# Patient Record
Sex: Female | Born: 1940 | Race: White
Health system: Western US, Academic
[De-identification: ages and names within clinical notes are randomized; demographics above are authoritative.]

## PROBLEM LIST (undated history)

## (undated) DIAGNOSIS — Z79899 Other long term (current) drug therapy: Secondary | ICD-10-CM

## (undated) DIAGNOSIS — D84821 Immunodeficiency due to drugs: Secondary | ICD-10-CM

## (undated) DIAGNOSIS — I10 Essential (primary) hypertension: Secondary | ICD-10-CM

## (undated) DIAGNOSIS — K743 Primary biliary cirrhosis: Secondary | ICD-10-CM

## (undated) DIAGNOSIS — C8310 Mantle cell lymphoma, unspecified site: Secondary | ICD-10-CM

## (undated) HISTORY — PX: TOTAL HIP ARTHROPLASTY: SHX124

## (undated) HISTORY — PX: LIVER TRANSPLANT: SHX410

---

## 2000-11-28 ENCOUNTER — Inpatient Hospital Stay: Payer: Self-pay

## 2000-12-01 ENCOUNTER — Encounter (HOSPITAL_COMMUNITY): Payer: Self-pay

## 2000-12-12 ENCOUNTER — Encounter (HOSPITAL_COMMUNITY): Payer: Self-pay

## 2000-12-13 ENCOUNTER — Other Ambulatory Visit (HOSPITAL_BASED_OUTPATIENT_CLINIC_OR_DEPARTMENT_OTHER): Payer: Self-pay | Admitting: Transplant Surgery

## 2000-12-15 LAB — PATHOLOGY, SURGICAL

## 2001-03-14 ENCOUNTER — Other Ambulatory Visit (HOSPITAL_BASED_OUTPATIENT_CLINIC_OR_DEPARTMENT_OTHER): Payer: Self-pay | Admitting: Transplant Surgery Medicine

## 2001-03-16 LAB — PATHOLOGY, SURGICAL

## 2001-06-21 ENCOUNTER — Encounter (HOSPITAL_COMMUNITY): Payer: Self-pay

## 2001-07-14 ENCOUNTER — Other Ambulatory Visit (HOSPITAL_BASED_OUTPATIENT_CLINIC_OR_DEPARTMENT_OTHER): Payer: Self-pay | Admitting: Gastroenterology

## 2001-07-14 ENCOUNTER — Ambulatory Visit: Payer: Enrolled Prime—HMO

## 2001-07-17 LAB — PATHOLOGY, SURGICAL

## 2002-01-03 ENCOUNTER — Ambulatory Visit: Payer: Enrolled Prime—HMO

## 2002-01-03 ENCOUNTER — Other Ambulatory Visit (HOSPITAL_BASED_OUTPATIENT_CLINIC_OR_DEPARTMENT_OTHER): Payer: Self-pay | Admitting: Gastroenterology

## 2002-01-03 ENCOUNTER — Other Ambulatory Visit: Payer: Enrolled Prime—HMO

## 2002-01-03 LAB — PROTHROMBIN AND PTT, EXTERNAL
Prothrombin INR, External: 0.9
Prothrombin Time Patient, External: 12.6
Prothrombin Time Patient, External: 24

## 2002-01-03 LAB — HEPATIC FUNCTION PANEL, EXTERNAL
ALT (GPT), External: 16
AST (GOT), External: 20
Albumin, External: 3.4
Alkaline Phosphatase (Total), External: 38
Bilirubin (Total), External: 0.7
Protein (Total), External: 5.9

## 2002-01-03 LAB — CBC (HEMOGRAM), EXTERNAL
Hematocrit, External: 31
MCV, External: 92
Platelet Count, External: 197
WBC, External: 4.45

## 2002-01-03 LAB — LIPID PANEL, EXTERNAL
Cholesterol (Total), External: 193
Triglyceride, External: 95

## 2002-01-03 LAB — PHOSPHATE, EXTERNAL: Phosphate, External: 4.3

## 2002-01-03 LAB — BASIC METABOLIC PANEL, EXTERNAL
Calcium, External: 9.3
Carbon Dioxide (Total), External: 25
Chloride, External: 108
Creatinine, External: 1.3
Glucose, External: 82
Potassium, External: 4.1
Sodium, External: 144
Urea Nitrogen, External: 37

## 2002-01-03 LAB — GAMMA GLUTAMYL TRANSFERASE, EXTERNAL: Gamma Glutamyl Transferase, External: 5

## 2002-01-03 LAB — MAGNESIUM, EXTERNAL: Magnesium, External: 2.1

## 2002-01-03 LAB — URIC ACID, EXTERNAL: Uric Acid, External: 4.9

## 2002-01-04 LAB — PATHOLOGY, SURGICAL

## 2002-01-09 ENCOUNTER — Encounter (HOSPITAL_COMMUNITY): Payer: Self-pay

## 2002-06-21 ENCOUNTER — Ambulatory Visit: Payer: Enrolled Prime—HMO

## 2002-06-21 ENCOUNTER — Other Ambulatory Visit (HOSPITAL_COMMUNITY): Payer: Self-pay

## 2002-06-21 ENCOUNTER — Other Ambulatory Visit: Payer: Enrolled Prime—HMO

## 2002-06-21 LAB — BASIC METABOLIC PANEL, EXTERNAL
Calcium, External: 9.6
Carbon Dioxide (Total), External: 24
Chloride, External: 107
Creatinine, External: 1.8
Glucose, External: 117
Potassium, External: 5
Sodium, External: 138
Urea Nitrogen, External: 46

## 2002-06-21 LAB — CBC (HEMOGRAM), EXTERNAL
Hematocrit, External: 29
MCV, External: 86
Platelet Count, External: 204
WBC, External: 3.29

## 2002-06-21 LAB — URIC ACID, EXTERNAL: Uric Acid, External: 5.8

## 2002-06-21 LAB — HEPATIC FUNCTION PANEL, EXTERNAL
ALT (GPT), External: 33
AST (GOT), External: 33
Albumin, External: 3.5
Alkaline Phosphatase (Total), External: 120
Bilirubin (Total), External: 0.5
Protein (Total), External: 6.5

## 2002-06-21 LAB — PROTHROMBIN AND PTT, EXTERNAL
Prothrombin INR, External: 1
Prothrombin Time Patient, External: 13.2
Prothrombin Time Patient, External: 27

## 2002-06-21 LAB — LIPID PANEL, EXTERNAL
Cholesterol (Total), External: 198
Triglyceride, External: 67

## 2002-06-21 LAB — MAGNESIUM, EXTERNAL: Magnesium, External: 1.9

## 2002-06-21 LAB — GAMMA GLUTAMYL TRANSFERASE, EXTERNAL: Gamma Glutamyl Transferase, External: 69

## 2002-06-21 LAB — PHOSPHATE, EXTERNAL: Phosphate, External: 4.9

## 2003-01-01 ENCOUNTER — Other Ambulatory Visit (HOSPITAL_COMMUNITY): Payer: Self-pay

## 2003-01-01 ENCOUNTER — Encounter (HOSPITAL_COMMUNITY): Payer: Self-pay

## 2003-01-01 ENCOUNTER — Ambulatory Visit: Payer: Enrolled Prime—HMO

## 2003-01-01 LAB — HEPATIC FUNCTION PANEL, EXTERNAL
ALT (GPT), External: 23
AST (GOT), External: 32
Albumin, External: 3.5
Alkaline Phosphatase (Total), External: 98
Bilirubin (Total), External: 0.6
Protein (Total), External: 6.6

## 2003-01-01 LAB — CBC (HEMOGRAM), EXTERNAL
Hematocrit, External: 27
MCV, External: 89
Platelet Count, External: 161
WBC, External: 2.82

## 2003-01-01 LAB — BASIC METABOLIC PANEL, EXTERNAL
Calcium, External: 9.3
Carbon Dioxide (Total), External: 24
Chloride, External: 109
Creatinine, External: 2
Glucose, External: 103
Potassium, External: 4.8
Sodium, External: 140
Urea Nitrogen, External: 46

## 2003-01-01 LAB — LIPID PANEL, EXTERNAL
Cholesterol (Total), External: 191
Triglyceride, External: 81

## 2003-01-01 LAB — URIC ACID, EXTERNAL: Uric Acid, External: 5.8

## 2003-01-01 LAB — PROTHROMBIN AND PTT, EXTERNAL
Prothrombin INR, External: 1
Prothrombin Time Patient, External: 13.8
Prothrombin Time Patient, External: 28

## 2003-01-01 LAB — MAGNESIUM, EXTERNAL: Magnesium, External: 2.3

## 2003-01-01 LAB — GAMMA GLUTAMYL TRANSFERASE, EXTERNAL: Gamma Glutamyl Transferase, External: 40

## 2003-01-01 LAB — PHOSPHATE, EXTERNAL: Phosphate, External: 4.2

## 2004-01-24 ENCOUNTER — Encounter (HOSPITAL_COMMUNITY): Payer: Self-pay

## 2004-07-08 ENCOUNTER — Other Ambulatory Visit (HOSPITAL_BASED_OUTPATIENT_CLINIC_OR_DEPARTMENT_OTHER): Payer: Self-pay | Admitting: Anatomic Pathology

## 2004-07-10 LAB — PATHOLOGY, SURGICAL

## 2004-11-17 ENCOUNTER — Encounter (HOSPITAL_COMMUNITY): Payer: Self-pay

## 2005-01-29 ENCOUNTER — Other Ambulatory Visit (HOSPITAL_COMMUNITY): Payer: Self-pay

## 2005-06-09 ENCOUNTER — Other Ambulatory Visit (HOSPITAL_COMMUNITY): Payer: Self-pay

## 2005-06-09 LAB — BASIC METABOLIC PANEL, EXTERNAL
Calcium, External: 9.4
Carbon Dioxide (Total), External: 25
Chloride, External: 103
Creatinine, External: 1.6
Glucose, External: 75
Potassium, External: 4.5
Sodium, External: 137
Urea Nitrogen, External: 40

## 2005-06-09 LAB — HEPATIC FUNCTION PANEL, EXTERNAL
ALT (GPT), External: 41
AST (GOT), External: 38
Bilirubin (Total), External: 0.3

## 2005-06-09 LAB — CBC (HEMOGRAM), EXTERNAL
Hematocrit, External: 30.9
MCV, External: 86.6
Platelet Count, External: 256
WBC, External: 3.2

## 2005-06-09 LAB — TACROLIMUS, EXTERNAL: Tacrolimus, External: 4

## 2005-06-09 LAB — LIPID PANEL, EXTERNAL
Cholesterol (Total), External: 185
Triglyceride, External: 74

## 2005-06-09 LAB — GAMMA GLUTAMYL TRANSFERASE, EXTERNAL: Gamma Glutamyl Transferase, External: 53

## 2005-06-09 LAB — PHOSPHATE, EXTERNAL: Phosphate, External: 3.5

## 2005-06-09 LAB — MAGNESIUM, EXTERNAL: Magnesium, External: 2.2

## 2005-07-20 ENCOUNTER — Other Ambulatory Visit (HOSPITAL_COMMUNITY): Payer: Self-pay

## 2005-07-20 LAB — BASIC METABOLIC PANEL, EXTERNAL
Calcium, External: 9.4
Carbon Dioxide (Total), External: 24
Chloride, External: 106
Creatinine, External: 1.9
Glucose, External: 96
Potassium, External: 4.9
Sodium, External: 138
Urea Nitrogen, External: 47

## 2005-07-20 LAB — HEPATIC FUNCTION PANEL, EXTERNAL
ALT (GPT), External: 42
AST (GOT), External: 35
Albumin, External: 4.2
Alkaline Phosphatase (Total), External: 101
Bilirubin (Total), External: 0.3
Protein (Total), External: 7.3

## 2005-07-20 LAB — TACROLIMUS, EXTERNAL: Tacrolimus, External: 3

## 2005-07-20 LAB — CBC (HEMOGRAM), EXTERNAL
Hematocrit, External: 34.5
MCV, External: 92
Platelet Count, External: 279
WBC, External: 3.54

## 2005-08-26 ENCOUNTER — Other Ambulatory Visit (HOSPITAL_COMMUNITY): Payer: Self-pay

## 2005-08-26 LAB — BASIC METABOLIC PANEL, EXTERNAL
Calcium, External: 9.3
Carbon Dioxide (Total), External: 26
Chloride, External: 104
Creatinine, External: 1.7
Glucose, External: 86
Potassium, External: 5.1
Sodium, External: 142
Urea Nitrogen, External: 34

## 2005-08-26 LAB — HEPATIC FUNCTION PANEL, EXTERNAL
ALT (GPT), External: 43
AST (GOT), External: 38
Albumin, External: 4.3
Alkaline Phosphatase (Total), External: 108
Bilirubin (Total), External: 0.4
Protein (Total), External: 7.4

## 2005-08-26 LAB — CBC (HEMOGRAM), EXTERNAL
Hematocrit, External: 33.7
MCV, External: 89.5
Platelet Count, External: 276
WBC, External: 3.78

## 2005-08-26 LAB — TACROLIMUS, EXTERNAL: Tacrolimus, External: 7.2

## 2005-11-19 ENCOUNTER — Encounter (HOSPITAL_COMMUNITY): Payer: Self-pay

## 2006-01-18 ENCOUNTER — Other Ambulatory Visit (HOSPITAL_COMMUNITY): Payer: Self-pay

## 2006-01-18 LAB — HEPATIC FUNCTION PANEL, EXTERNAL
ALT (GPT), External: 42
AST (GOT), External: 32
Albumin, External: 4.3
Alkaline Phosphatase (Total), External: 102
Bilirubin (Total), External: 0.3
Protein (Total), External: 7.1

## 2006-01-18 LAB — LIPID PANEL, EXTERNAL
Cholesterol (HDL), External: 52
Cholesterol (LDL), External: 108
Cholesterol (Total), External: 183
Triglyceride, External: 113

## 2006-01-18 LAB — BASIC METABOLIC PANEL, EXTERNAL
Calcium, External: 9.5
Carbon Dioxide (Total), External: 26
Chloride, External: 104
Creatinine, External: 1.6
Glucose, External: 92
Potassium, External: 4.6
Sodium, External: 140
Urea Nitrogen, External: 41

## 2006-01-18 LAB — CBC (HEMOGRAM), EXTERNAL
Hematocrit, External: 32.8
MCV, External: 91.9
Platelet Count, External: 283
WBC, External: 3.41

## 2006-01-18 LAB — MAGNESIUM, EXTERNAL: Magnesium, External: 2.1

## 2006-01-18 LAB — PROTHROMBIN AND PTT, EXTERNAL
Prothrombin INR, External: 1
Prothrombin Time Patient, External: 12.9

## 2006-01-18 LAB — GAMMA GLUTAMYL TRANSFERASE, EXTERNAL: Gamma Glutamyl Transferase, External: 68

## 2006-01-18 LAB — PROTEIN/CREATININE RATIO, URN, EXTERNAL: Protein (Total)/24 hr, URN, External: 121

## 2006-01-18 LAB — CREATININE CLEARANCE, URN, EXTERNAL
Creatinine Clearance, External: 32
Total Volume, External: 3025

## 2006-01-18 LAB — CYCLOSPORINE, EXTERNAL: Cyclosporine By LC_MS, External: <50

## 2006-02-21 ENCOUNTER — Other Ambulatory Visit (HOSPITAL_COMMUNITY): Payer: Self-pay

## 2006-02-21 LAB — HEPATIC FUNCTION PANEL, EXTERNAL
ALT (GPT), External: 36
AST (GOT), External: 28
Albumin, External: 4.5
Alkaline Phosphatase (Total), External: 108
Bilirubin (Total), External: 0.3
Protein (Total), External: 7.3

## 2006-02-21 LAB — CBC (HEMOGRAM), EXTERNAL
Hematocrit, External: 32
MCV, External: 89.2
Platelet Count, External: 295
WBC, External: 3.32

## 2006-02-21 LAB — BASIC METABOLIC PANEL, EXTERNAL
Calcium, External: 9.5
Carbon Dioxide (Total), External: 27
Chloride, External: 106
Creatinine, External: 1.5
Glucose, External: 101
Potassium, External: 4.9
Sodium, External: 141
Urea Nitrogen, External: 48

## 2006-02-21 LAB — TACROLIMUS, EXTERNAL: Tacrolimus, External: 6.3

## 2006-04-18 ENCOUNTER — Encounter: Payer: PRIVATE HEALTH INSURANCE | Admitting: Nephrology

## 2006-06-08 ENCOUNTER — Encounter (HOSPITAL_BASED_OUTPATIENT_CLINIC_OR_DEPARTMENT_OTHER): Payer: TRICARE Standard - Fee For Service | Admitting: Nephrology

## 2006-07-18 ENCOUNTER — Other Ambulatory Visit (HOSPITAL_COMMUNITY): Payer: Self-pay

## 2006-08-01 ENCOUNTER — Other Ambulatory Visit (HOSPITAL_COMMUNITY): Payer: Self-pay

## 2006-08-01 LAB — CBC (HEMOGRAM), EXTERNAL
Hematocrit, External: 32
Hemoglobin, External: 10.9
MCV, External: 88.1
WBC, External: 4.07

## 2006-08-01 LAB — HEPATIC FUNCTION PANEL, EXTERNAL
ALT (GPT), External: 57
AST (GOT), External: 48
Albumin, External: 4.3
Alkaline Phosphatase (Total), External: 168
Bilirubin (Total), External: 0.3
Protein (Total), External: 7.4

## 2006-08-01 LAB — BASIC METABOLIC PANEL, EXTERNAL
Calcium, External: 9.4
Carbon Dioxide (Total), External: 29
Chloride, External: 104
Creatinine, External: 1.6
Glucose, External: 92
Potassium, External: 4.7
Sodium, External: 138
Urea Nitrogen, External: 37

## 2006-08-01 LAB — TACROLIMUS, EXTERNAL: Tacrolimus, External: 5

## 2006-09-07 ENCOUNTER — Encounter (HOSPITAL_BASED_OUTPATIENT_CLINIC_OR_DEPARTMENT_OTHER): Payer: TRICARE Standard - Fee For Service | Admitting: Nephrology

## 2006-09-14 ENCOUNTER — Encounter (HOSPITAL_BASED_OUTPATIENT_CLINIC_OR_DEPARTMENT_OTHER): Payer: TRICARE Standard - Fee For Service | Admitting: Nephrology

## 2006-09-16 ENCOUNTER — Other Ambulatory Visit (HOSPITAL_COMMUNITY): Payer: Self-pay

## 2006-09-16 LAB — CBC (HEMOGRAM), EXTERNAL
Hematocrit, External: 30.2
Hemoglobin, External: 10.6
MCV, External: 85.7
Platelet Count, External: 283
WBC, External: 2.89

## 2006-09-16 LAB — BASIC METABOLIC PANEL, EXTERNAL
Calcium, External: 9.5
Carbon Dioxide (Total), External: 28
Chloride, External: 106
Creatinine, External: 2.1
Glucose, External: 97
Potassium, External: 4.5
Sodium, External: 141
Urea Nitrogen, External: 42

## 2006-09-16 LAB — HEPATIC FUNCTION PANEL, EXTERNAL
ALT (GPT), External: 49
AST (GOT), External: 38
Albumin, External: 4.2
Alkaline Phosphatase (Total), External: 159
Bilirubin (Total), External: 0.3
Protein (Total), External: 7.3

## 2006-09-16 LAB — GAMMA GLUTAMYL TRANSFERASE, EXTERNAL: Gamma Glutamyl Transferase, External: 111

## 2006-09-16 LAB — TACROLIMUS, EXTERNAL: Tacrolimus, External: 4

## 2006-10-28 ENCOUNTER — Other Ambulatory Visit (HOSPITAL_COMMUNITY): Payer: Self-pay

## 2006-10-28 LAB — BASIC METABOLIC PANEL, EXTERNAL
Calcium, External: 9.6
Carbon Dioxide (Total), External: 26
Chloride, External: 106
Creatinine, External: 1.7
Glucose, External: 91
Potassium, External: 4.7
Sodium, External: 143
Urea Nitrogen, External: 48

## 2006-10-28 LAB — HEPATIC FUNCTION PANEL, EXTERNAL
ALT (GPT), External: 53
AST (GOT), External: 40
Albumin, External: 4.2
Alkaline Phosphatase (Total), External: 156
Bilirubin (Total), External: 0.3
Protein (Total), External: 7.2

## 2006-10-28 LAB — TACROLIMUS, EXTERNAL: Tacrolimus, External: 5

## 2006-10-28 LAB — CBC (HEMOGRAM), EXTERNAL
Hematocrit, External: 32
Hemoglobin, External: 10.8
MCV, External: 87.4
Platelet Count, External: 305
WBC, External: 4.03

## 2006-10-28 LAB — GAMMA GLUTAMYL TRANSFERASE, EXTERNAL: Gamma Glutamyl Transferase, External: 111

## 2006-12-19 ENCOUNTER — Other Ambulatory Visit (HOSPITAL_COMMUNITY): Payer: Self-pay

## 2006-12-19 LAB — HEPATIC FUNCTION PANEL, EXTERNAL
ALT (GPT), External: 46
AST (GOT), External: 41
Albumin, External: 4.3
Alkaline Phosphatase (Total), External: 171
Bilirubin (Total), External: 0.4
Protein (Total), External: 7.6

## 2006-12-19 LAB — GAMMA GLUTAMYL TRANSFERASE, EXTERNAL: Gamma Glutamyl Transferase, External: 105

## 2006-12-19 LAB — BASIC METABOLIC PANEL, EXTERNAL
Calcium, External: 9.7
Carbon Dioxide (Total), External: 24
Chloride, External: 106
Creatinine, External: 1.7
Glucose, External: 93
Potassium, External: 4.9
Sodium, External: 145
Urea Nitrogen, External: 37

## 2006-12-19 LAB — CBC (HEMOGRAM), EXTERNAL
Hematocrit, External: 32.8
Hemoglobin, External: 11.3
MCV, External: 84.2
Platelet Count, External: 348
WBC, External: 4.06

## 2006-12-19 LAB — LIPID PANEL, EXTERNAL
Cholesterol (HDL), External: 59
Cholesterol (LDL), External: 141
Cholesterol (Total), External: 225
Triglyceride, External: 124

## 2006-12-19 LAB — PROTHROMBIN AND PTT, EXTERNAL
Prothrombin INR, External: 1
Prothrombin Time Patient, External: 12.9

## 2006-12-19 LAB — MAGNESIUM, EXTERNAL: Magnesium, External: 2.3

## 2006-12-19 LAB — CYCLOSPORINE, EXTERNAL: Cyclosporine By LC_MS, External: <50

## 2006-12-19 LAB — URIC ACID, EXTERNAL: Uric Acid, External: 7.1

## 2006-12-19 LAB — TACROLIMUS, EXTERNAL: Tacrolimus, External: 4.2

## 2007-01-03 ENCOUNTER — Other Ambulatory Visit (HOSPITAL_COMMUNITY): Payer: Self-pay

## 2007-01-03 LAB — PROTEIN/CREATININE RATIO, URN, EXTERNAL
Creatinine/Unit, URN, External: 30
Protein (Total)/24 hr, URN, External: 70

## 2007-01-03 LAB — CREATININE CLEARANCE, URN, EXTERNAL
Creatinine Clearance, External: 35
Total Volume, External: 3500

## 2007-01-03 LAB — BASIC METABOLIC PANEL, EXTERNAL: Creatinine, External: 1.7

## 2007-02-24 ENCOUNTER — Encounter (HOSPITAL_BASED_OUTPATIENT_CLINIC_OR_DEPARTMENT_OTHER): Payer: TRICARE Standard - Fee For Service

## 2007-03-03 ENCOUNTER — Encounter (HOSPITAL_BASED_OUTPATIENT_CLINIC_OR_DEPARTMENT_OTHER): Payer: TRICARE Standard - Fee For Service

## 2007-03-15 ENCOUNTER — Encounter (HOSPITAL_BASED_OUTPATIENT_CLINIC_OR_DEPARTMENT_OTHER): Payer: TRICARE Standard - Fee For Service | Admitting: Nephrology

## 2007-03-22 ENCOUNTER — Other Ambulatory Visit (HOSPITAL_COMMUNITY): Payer: Self-pay

## 2007-03-22 LAB — BASIC METABOLIC PANEL, EXTERNAL
Calcium, External: 10.1
Carbon Dioxide (Total), External: 30
Chloride, External: 105
Creatinine, External: 1.8
Glucose, External: 91
Potassium, External: 4.4
Sodium, External: 142
Urea Nitrogen, External: 36

## 2007-03-22 LAB — CBC (HEMOGRAM), EXTERNAL
Hematocrit, External: 31.5
Hemoglobin, External: 10.8
MCV, External: 87.4
Platelet Count, External: 327
WBC, External: 3.45

## 2007-03-22 LAB — HEPATIC FUNCTION PANEL, EXTERNAL
ALT (GPT), External: 50
AST (GOT), External: 41
Albumin, External: 4.3
Alkaline Phosphatase (Total), External: 171
Bilirubin (Total), External: 0.4
Protein (Total), External: 7.4

## 2007-03-22 LAB — GAMMA GLUTAMYL TRANSFERASE, EXTERNAL: Gamma Glutamyl Transferase, External: 116

## 2007-03-22 LAB — TACROLIMUS, EXTERNAL: Tacrolimus, External: 5

## 2007-05-31 ENCOUNTER — Other Ambulatory Visit (HOSPITAL_COMMUNITY): Payer: Self-pay

## 2007-05-31 LAB — CBC (HEMOGRAM), EXTERNAL
Hematocrit, External: 33.3
Hemoglobin, External: 10.7
MCV, External: 88.5
Platelet Count, External: 335
WBC, External: 4.22

## 2007-05-31 LAB — HEPATIC FUNCTION PANEL, EXTERNAL
ALT (GPT), External: 58
AST (GOT), External: 42
Albumin, External: 4.3
Alkaline Phosphatase (Total), External: 182
Bilirubin (Total), External: 0.4
Protein (Total), External: 7

## 2007-05-31 LAB — TACROLIMUS, EXTERNAL: Tacrolimus, External: 4

## 2007-05-31 LAB — BASIC METABOLIC PANEL, EXTERNAL
Calcium, External: 9.7
Carbon Dioxide (Total), External: 28
Chloride, External: 106
Creatinine, External: 1.7
Glucose, External: 93
Potassium, External: 4.9
Sodium, External: 140
Urea Nitrogen, External: 44

## 2007-05-31 LAB — GAMMA GLUTAMYL TRANSFERASE, EXTERNAL: Gamma Glutamyl Transferase, External: 120

## 2007-05-31 LAB — CBC, DIFF, EXTERNAL: Neutrophils, External: 2.82

## 2007-07-04 ENCOUNTER — Other Ambulatory Visit (HOSPITAL_COMMUNITY): Payer: Self-pay

## 2007-07-18 ENCOUNTER — Other Ambulatory Visit (HOSPITAL_COMMUNITY): Payer: Self-pay

## 2007-07-18 LAB — GAMMA GLUTAMYL TRANSFERASE, EXTERNAL: Gamma Glutamyl Transferase, External: 121

## 2007-07-18 LAB — BASIC METABOLIC PANEL, EXTERNAL
Calcium, External: 9.9
Carbon Dioxide (Total), External: 26
Chloride, External: 105
Creatinine, External: 1.8
Glucose, External: 96
Potassium, External: 4.7
Sodium, External: 141
Urea Nitrogen, External: 46

## 2007-07-18 LAB — HEPATIC FUNCTION PANEL, EXTERNAL
ALT (GPT), External: 58
AST (GOT), External: 44
Albumin, External: 4.4
Alkaline Phosphatase (Total), External: 190
Bilirubin (Total), External: 0.4
Protein (Total), External: 7.2

## 2007-07-18 LAB — CBC (HEMOGRAM), EXTERNAL
Hematocrit, External: 30.8
Hemoglobin, External: 10.6
MCV, External: 85.9
Platelet Count, External: 312
WBC, External: 4.49

## 2007-07-18 LAB — CBC, DIFF, EXTERNAL: Neutrophils, External: 3.05

## 2007-07-19 ENCOUNTER — Encounter (HOSPITAL_BASED_OUTPATIENT_CLINIC_OR_DEPARTMENT_OTHER): Payer: TRICARE Standard - Fee For Service | Admitting: Nephrology

## 2007-08-17 ENCOUNTER — Other Ambulatory Visit (HOSPITAL_COMMUNITY): Payer: Self-pay

## 2007-08-17 LAB — CBC (HEMOGRAM), EXTERNAL
Hematocrit, External: 31.8
Hemoglobin, External: 10.4
MCV, External: 88.7
Platelet Count, External: 325
WBC, External: 4.08

## 2007-08-17 LAB — HEPATIC FUNCTION PANEL, EXTERNAL
ALT (GPT), External: 64
AST (GOT), External: 51
Albumin, External: 4.3
Alkaline Phosphatase (Total), External: 211
Bilirubin (Total), External: 0.4
Protein (Total), External: 7.3

## 2007-08-17 LAB — BASIC METABOLIC PANEL, EXTERNAL
Calcium, External: 9.7
Carbon Dioxide (Total), External: 26
Chloride, External: 106
Creatinine, External: 1.8
Glucose, External: 97
Potassium, External: 5
Sodium, External: 141
Urea Nitrogen, External: 49

## 2007-08-17 LAB — 1,25 DIHYDROXY VITAMIN D, EXTERNAL: Vitamin D (1,25 Dihydroxy): 47

## 2007-08-17 LAB — GAMMA GLUTAMYL TRANSFERASE, EXTERNAL: Gamma Glutamyl Transferase, External: 136

## 2007-08-17 LAB — TACROLIMUS, EXTERNAL: Tacrolimus, External: 5

## 2007-08-17 LAB — CBC, DIFF, EXTERNAL: Neutrophils, External: 2.64

## 2007-09-20 ENCOUNTER — Other Ambulatory Visit (HOSPITAL_COMMUNITY): Payer: Self-pay

## 2007-09-20 LAB — CBC (HEMOGRAM), EXTERNAL
Hematocrit, External: 32.7
Hemoglobin, External: 10.7
MCV, External: 89.4
Platelet Count, External: 309
WBC, External: 3.61

## 2007-09-20 LAB — BASIC METABOLIC PANEL, EXTERNAL
Calcium, External: 9.5
Carbon Dioxide (Total), External: 27
Chloride, External: 106
Creatinine, External: 1.8
Glucose, External: 102
Potassium, External: 4.5
Sodium, External: 142
Urea Nitrogen, External: 41

## 2007-09-20 LAB — HEPATIC FUNCTION PANEL, EXTERNAL
ALT (GPT), External: 52
AST (GOT), External: 47
Albumin, External: 4.2
Alkaline Phosphatase (Total), External: 218
Bilirubin (Total), External: 0.4
Protein (Total), External: 7.3

## 2007-09-20 LAB — TACROLIMUS, EXTERNAL: Tacrolimus, External: 8.5

## 2007-09-20 LAB — GAMMA GLUTAMYL TRANSFERASE, EXTERNAL: Gamma Glutamyl Transferase, External: 137

## 2007-09-20 LAB — CBC, DIFF, EXTERNAL: Neutrophils, External: 2.33

## 2007-09-21 ENCOUNTER — Other Ambulatory Visit (HOSPITAL_COMMUNITY): Payer: Self-pay

## 2007-10-19 ENCOUNTER — Other Ambulatory Visit (HOSPITAL_COMMUNITY): Payer: Self-pay

## 2007-10-19 LAB — CBC (HEMOGRAM), EXTERNAL
Hematocrit, External: 32.2
Hemoglobin, External: 10.8
MCV, External: 89.3
Platelet Count, External: 303
WBC, External: 3.54

## 2007-10-19 LAB — HEPATIC FUNCTION PANEL, EXTERNAL
ALT (GPT), External: 65
AST (GOT), External: 47
Albumin, External: 4.3
Alkaline Phosphatase (Total), External: 232
Bilirubin (Total), External: 0.4
Protein (Total), External: 7.3

## 2007-10-19 LAB — BASIC METABOLIC PANEL, EXTERNAL
Calcium, External: 9.7
Carbon Dioxide (Total), External: 26
Chloride, External: 105
Creatinine, External: 1.7
Glucose, External: 92
Potassium, External: 4.3
Sodium, External: 141
Urea Nitrogen, External: 42

## 2007-10-19 LAB — TACROLIMUS, EXTERNAL: Tacrolimus, External: 4

## 2007-10-19 LAB — GAMMA GLUTAMYL TRANSFERASE, EXTERNAL: Gamma Glutamyl Transferase, External: 157

## 2007-10-19 LAB — CBC, DIFF, EXTERNAL: Neutrophils, External: 2.27

## 2007-12-13 ENCOUNTER — Other Ambulatory Visit (HOSPITAL_COMMUNITY): Payer: Self-pay

## 2007-12-13 LAB — CBC (HEMOGRAM), EXTERNAL
Hematocrit, External: 32
Hemoglobin, External: 10.9
MCV, External: 85.9
Platelet Count, External: 332
WBC, External: 3.57

## 2007-12-13 LAB — CBC, DIFF, EXTERNAL: Neutrophils, External: 2.19

## 2007-12-13 LAB — BASIC METABOLIC PANEL, EXTERNAL
Calcium, External: 10
Carbon Dioxide (Total), External: 28
Chloride, External: 106
Creatinine, External: 1.8
Glucose, External: 91
Potassium, External: 4.7
Sodium, External: 142
Urea Nitrogen, External: 44

## 2007-12-13 LAB — HEPATIC FUNCTION PANEL, EXTERNAL
ALT (GPT), External: 69
AST (GOT), External: 50
Albumin, External: 4.4
Alkaline Phosphatase (Total), External: 250
Bilirubin (Total), External: 0.4
Protein (Total), External: 7.4

## 2007-12-13 LAB — TACROLIMUS, EXTERNAL: Tacrolimus, External: 4

## 2007-12-13 LAB — GAMMA GLUTAMYL TRANSFERASE, EXTERNAL: Gamma Glutamyl Transferase, External: 175

## 2008-01-17 ENCOUNTER — Other Ambulatory Visit (HOSPITAL_COMMUNITY): Payer: Self-pay

## 2008-01-17 LAB — HEPATIC FUNCTION PANEL, EXTERNAL
ALT (GPT), External: 66
AST (GOT), External: 53
Albumin, External: 4.4
Alkaline Phosphatase (Total), External: 226
Bilirubin (Total), External: 0.4
Protein (Total), External: 7.4

## 2008-01-17 LAB — URIC ACID, EXTERNAL: Uric Acid, External: 6.8

## 2008-01-17 LAB — CBC, DIFF, EXTERNAL: Neutrophils, External: 2.96

## 2008-01-17 LAB — BASIC METABOLIC PANEL, EXTERNAL
Calcium, External: 9.7
Carbon Dioxide (Total), External: 26
Chloride, External: 101
Creatinine, External: 1.7
Glucose, External: 91
Potassium, External: 4.1
Sodium, External: 141
Urea Nitrogen, External: 39

## 2008-01-17 LAB — CBC (HEMOGRAM), EXTERNAL
Hematocrit, External: 31.9
Hemoglobin, External: 10.9
MCV, External: 85.8
Platelet Count, External: 348
WBC, External: 4.37

## 2008-01-17 LAB — PROTHROMBIN AND PTT, EXTERNAL
Prothrombin INR, External: 1
Prothrombin Time Patient, External: 12.8

## 2008-01-17 LAB — CREATININE CLEARANCE, URN, EXTERNAL
Creatinine Clearance, External: 21
Total Volume, External: 4350

## 2008-01-17 LAB — LIPID PANEL, EXTERNAL
Cholesterol (HDL), External: 61
Cholesterol (LDL), External: 126
Cholesterol (Total), External: 213
Triglyceride, External: 132

## 2008-01-17 LAB — PROTEIN/CREATININE RATIO, URN, EXTERNAL
Creatinine/24hr, URN, External: 609
Creatinine/Unit, URN, External: 14
Protein (Total)/24 hr, URN, External: 44

## 2008-01-17 LAB — MAGNESIUM, EXTERNAL: Magnesium, External: 2.1

## 2008-01-17 LAB — COMPREHENSIVE METABOLIC PANEL, EXTERNAL: GFR, Calc, European American, External: 32

## 2008-02-23 ENCOUNTER — Encounter (HOSPITAL_BASED_OUTPATIENT_CLINIC_OR_DEPARTMENT_OTHER): Payer: TRICARE Standard - Fee For Service

## 2008-04-22 ENCOUNTER — Other Ambulatory Visit (HOSPITAL_COMMUNITY): Payer: Self-pay

## 2008-04-22 LAB — BASIC METABOLIC PANEL, EXTERNAL
Calcium, External: 10
Carbon Dioxide (Total), External: 28
Chloride, External: 103
Creatinine, External: 1.7
Glucose, External: 97
Potassium, External: 4.4
Sodium, External: 142
Urea Nitrogen, External: 40

## 2008-04-22 LAB — CBC (HEMOGRAM), EXTERNAL
Hematocrit, External: 34.4
Hemoglobin, External: 11.2
MCV, External: 89.1
Platelet Count, External: 325
WBC, External: 4.88

## 2008-04-22 LAB — HEPATIC FUNCTION PANEL, EXTERNAL
ALT (GPT), External: 68
AST (GOT), External: 56
Albumin, External: 4.3
Alkaline Phosphatase (Total), External: 268
Bilirubin (Total), External: 0.3
Protein (Total), External: 7.4

## 2008-04-22 LAB — CBC, DIFF, EXTERNAL: Neutrophils, External: 3.1

## 2008-04-22 LAB — TACROLIMUS, EXTERNAL: Tacrolimus, External: 4.4

## 2008-04-22 LAB — GAMMA GLUTAMYL TRANSFERASE, EXTERNAL: Gamma Glutamyl Transferase, External: 214

## 2008-04-22 LAB — COMPREHENSIVE METABOLIC PANEL, EXTERNAL: GFR, Calc, European American, External: 32

## 2008-06-21 ENCOUNTER — Other Ambulatory Visit (HOSPITAL_COMMUNITY): Payer: Self-pay

## 2008-06-21 LAB — HEPATIC FUNCTION PANEL, EXTERNAL
ALT (GPT), External: 67
AST (GOT), External: 55
Albumin, External: 4
Alkaline Phosphatase (Total), External: 295
Bilirubin (Total), External: 0.4
Protein (Total), External: 7.6

## 2008-06-21 LAB — BASIC METABOLIC PANEL, EXTERNAL
Calcium, External: 9.9
Carbon Dioxide (Total), External: 26
Chloride, External: 102
Creatinine, External: 1.7
Glucose, External: 97
Potassium, External: 4.4
Sodium, External: 142
Urea Nitrogen, External: 40

## 2008-06-21 LAB — CBC (HEMOGRAM), EXTERNAL
Hematocrit, External: 34.8
Hemoglobin, External: 11.8
MCV, External: 85.7
Platelet Count, External: 335
WBC, External: 4.8

## 2008-06-21 LAB — TACROLIMUS, EXTERNAL: Tacrolimus, External: 4.7

## 2008-06-21 LAB — COMPREHENSIVE METABOLIC PANEL, EXTERNAL: GFR, Calc, European American, External: 32

## 2008-06-21 LAB — GAMMA GLUTAMYL TRANSFERASE, EXTERNAL: Gamma Glutamyl Transferase, External: 214

## 2008-06-21 LAB — CBC, DIFF, EXTERNAL: Neutrophils, External: 3.17

## 2008-08-20 ENCOUNTER — Other Ambulatory Visit (HOSPITAL_COMMUNITY): Payer: Self-pay

## 2008-08-20 LAB — BASIC METABOLIC PANEL, EXTERNAL
Calcium, External: 9.6
Carbon Dioxide (Total), External: 28
Chloride, External: 103
Creatinine, External: 1.8
Glucose, External: 96
Potassium, External: 4.2
Sodium, External: 140
Urea Nitrogen, External: 44

## 2008-08-20 LAB — COMPREHENSIVE METABOLIC PANEL, EXTERNAL: GFR, Calc, European American, External: 30

## 2008-08-20 LAB — CBC (HEMOGRAM), EXTERNAL
Hematocrit, External: 34.9
Hemoglobin, External: 11.3
MCV, External: 88.4
Platelet Count, External: 310
WBC, External: 3.92

## 2008-08-20 LAB — HEPATIC FUNCTION PANEL, EXTERNAL
ALT (GPT), External: 70
AST (GOT), External: 56
Albumin, External: 4.1
Alkaline Phosphatase (Total), External: 386
Bilirubin (Total), External: 0.4
Protein (Total), External: 7.2

## 2008-08-20 LAB — CBC, DIFF, EXTERNAL: Neutrophils, External: 2.5

## 2008-08-20 LAB — TACROLIMUS, EXTERNAL: Tacrolimus, External: 4.2

## 2008-08-20 LAB — GAMMA GLUTAMYL TRANSFERASE, EXTERNAL: Gamma Glutamyl Transferase, External: 208

## 2008-12-05 ENCOUNTER — Other Ambulatory Visit (HOSPITAL_COMMUNITY): Payer: Self-pay

## 2008-12-05 LAB — CBC (HEMOGRAM), EXTERNAL
Hematocrit, External: 35.1
Hemoglobin, External: 11.8
MCV, External: 88
Platelet Count, External: 271
WBC, External: 4.32

## 2008-12-05 LAB — HEPATIC FUNCTION PANEL, EXTERNAL
ALT (GPT), External: 42
AST (GOT), External: 34
Albumin, External: 4.5
Alkaline Phosphatase (Total), External: 191
Bilirubin (Total), External: 0.5
Protein (Total), External: 7.5

## 2008-12-05 LAB — TACROLIMUS, EXTERNAL: Tacrolimus, External: 4

## 2008-12-05 LAB — BASIC METABOLIC PANEL, EXTERNAL
Calcium, External: 9.7
Carbon Dioxide (Total), External: 28
Chloride, External: 103
Creatinine, External: 1.5
Glucose, External: 89
Potassium, External: 4.2
Sodium, External: 140
Urea Nitrogen, External: 38

## 2008-12-05 LAB — COMPREHENSIVE METABOLIC PANEL, EXTERNAL: GFR, Calc, European American, External: 37

## 2008-12-05 LAB — CBC, DIFF, EXTERNAL: Neutrophils, External: 2.82

## 2008-12-05 LAB — GAMMA GLUTAMYL TRANSFERASE, EXTERNAL: Gamma Glutamyl Transferase, External: 56

## 2009-01-07 ENCOUNTER — Other Ambulatory Visit (HOSPITAL_COMMUNITY): Payer: Self-pay

## 2009-01-07 LAB — PROTHROMBIN AND PTT, EXTERNAL
Prothrombin INR, External: 1
Prothrombin Time Patient, External: 12.9

## 2009-01-07 LAB — CREATININE CLEARANCE, URN, EXTERNAL
Creatinine Clearance, External: 34
Total Volume, External: 3200

## 2009-01-07 LAB — LIPID PANEL, EXTERNAL
Cholesterol (HDL), External: 69
Cholesterol (LDL), External: 142
Cholesterol (Total), External: 227
Triglyceride, External: 82

## 2009-01-07 LAB — HEPATIC FUNCTION PANEL, EXTERNAL
ALT (GPT), External: 49
AST (GOT), External: 34
Albumin, External: 4.5
Alkaline Phosphatase (Total), External: 227
Bilirubin (Total), External: 0.5
Protein (Total), External: 7.5

## 2009-01-07 LAB — BASIC METABOLIC PANEL, EXTERNAL
Calcium, External: 9.5
Carbon Dioxide (Total), External: 27
Chloride, External: 104
Creatinine, External: 1.6
Glucose, External: 95
Potassium, External: 3.9
Sodium, External: 140
Urea Nitrogen, External: 42

## 2009-01-07 LAB — 1,25 DIHYDROXY VITAMIN D, EXTERNAL: Vitamin D (1,25 Dihydroxy): 51

## 2009-01-07 LAB — PROTEIN/CREATININE RATIO, URN, EXTERNAL
Creatinine/Unit, URN, External: 30
Protein (Total)/24 hr, URN, External: 64

## 2009-01-07 LAB — GAMMA GLUTAMYL TRANSFERASE, EXTERNAL: Gamma Glutamyl Transferase, External: 62

## 2009-01-07 LAB — COMPREHENSIVE METABOLIC PANEL, EXTERNAL: GFR, Calc, European American, External: 34

## 2009-01-07 LAB — CBC (HEMOGRAM), EXTERNAL
Hematocrit, External: 35.8
Hemoglobin, External: 12
MCV, External: 88.7
Platelet Count, External: 267
WBC, External: 4.09

## 2009-01-07 LAB — PHOSPHATE, EXTERNAL: Phosphate, External: 3.8

## 2009-01-07 LAB — CBC, DIFF, EXTERNAL: Neutrophils, External: 2.66

## 2009-01-07 LAB — TACROLIMUS, EXTERNAL: Tacrolimus, External: 3.7

## 2009-01-07 LAB — MAGNESIUM, EXTERNAL: Magnesium, External: 2.3

## 2009-02-21 ENCOUNTER — Encounter (HOSPITAL_BASED_OUTPATIENT_CLINIC_OR_DEPARTMENT_OTHER): Payer: TRICARE Standard - Fee For Service

## 2009-02-28 ENCOUNTER — Encounter (HOSPITAL_BASED_OUTPATIENT_CLINIC_OR_DEPARTMENT_OTHER): Payer: TRICARE Standard - Fee For Service

## 2009-03-10 ENCOUNTER — Other Ambulatory Visit (HOSPITAL_COMMUNITY): Payer: Self-pay

## 2009-03-10 LAB — BASIC METABOLIC PANEL, EXTERNAL
Calcium, External: 9.5
Carbon Dioxide (Total), External: 30
Chloride, External: 105
Creatinine, External: 1.6
Glucose, External: 100
Potassium, External: 4.4
Sodium, External: 139
Urea Nitrogen, External: 39

## 2009-03-10 LAB — TACROLIMUS, EXTERNAL: Tacrolimus, External: 5

## 2009-03-10 LAB — CBC, DIFF, EXTERNAL: Neutrophils, External: 3.19

## 2009-03-10 LAB — CBC (HEMOGRAM), EXTERNAL
Hematocrit, External: 35.2
Hemoglobin, External: 11.6
MCV, External: 87.3
Platelet Count, External: 12.2
WBC, External: 4.8

## 2009-03-10 LAB — COMPREHENSIVE METABOLIC PANEL, EXTERNAL: GFR, Calc, European American, External: 34

## 2009-03-10 LAB — HEPATIC FUNCTION PANEL, EXTERNAL
ALT (GPT), External: 45
AST (GOT), External: 40
Albumin, External: 4.3
Alkaline Phosphatase (Total), External: 139
Bilirubin (Total), External: 0.4
Protein (Total), External: 7.1

## 2009-03-10 LAB — GAMMA GLUTAMYL TRANSFERASE, EXTERNAL: Gamma Glutamyl Transferase, External: 47

## 2009-05-09 ENCOUNTER — Ambulatory Visit: Payer: Medicare Other | Attending: Gastroenterology

## 2009-05-09 DIAGNOSIS — Z944 Liver transplant status: Secondary | ICD-10-CM | POA: Insufficient documentation

## 2009-05-09 DIAGNOSIS — K745 Biliary cirrhosis, unspecified: Secondary | ICD-10-CM | POA: Insufficient documentation

## 2009-05-23 ENCOUNTER — Other Ambulatory Visit (HOSPITAL_COMMUNITY): Payer: Self-pay

## 2009-05-28 ENCOUNTER — Other Ambulatory Visit (HOSPITAL_COMMUNITY): Payer: Self-pay

## 2009-06-25 ENCOUNTER — Other Ambulatory Visit (HOSPITAL_COMMUNITY): Payer: Self-pay

## 2009-06-25 LAB — CBC (HEMOGRAM), EXTERNAL
Hematocrit, External: 35.8
Hemoglobin, External: 11.7
MCV, External: 88.6
Platelet Count, External: 285
WBC, External: 4.73

## 2009-06-25 LAB — BASIC METABOLIC PANEL, EXTERNAL
Calcium, External: 9.7
Carbon Dioxide (Total), External: 28
Chloride, External: 105
Creatinine, External: 1.4
Glucose, External: 95
Potassium, External: 4.5
Sodium, External: 139
Urea Nitrogen, External: 34

## 2009-06-25 LAB — HEPATIC FUNCTION PANEL, EXTERNAL
ALT (GPT), External: 39
AST (GOT), External: 32
Albumin, External: 4.4
Alkaline Phosphatase (Total), External: 146
Bilirubin (Total), External: 0.4
Protein (Total), External: 7.1

## 2009-06-25 LAB — TACROLIMUS, EXTERNAL: Tacrolimus, External: 3

## 2009-06-25 LAB — MAGNESIUM, EXTERNAL: Magnesium, External: 2

## 2009-06-25 LAB — GAMMA GLUTAMYL TRANSFERASE, EXTERNAL: Gamma Glutamyl Transferase, External: 38

## 2009-06-25 LAB — URIC ACID, EXTERNAL: Uric Acid, External: 5.3

## 2009-06-25 LAB — COMPREHENSIVE METABOLIC PANEL, EXTERNAL: GFR, Calc, European American, External: 40

## 2009-06-25 LAB — CBC, DIFF, EXTERNAL: Neutrophils, External: 3.15

## 2009-10-30 ENCOUNTER — Other Ambulatory Visit (HOSPITAL_COMMUNITY): Payer: Self-pay

## 2009-10-30 LAB — BASIC METABOLIC PANEL, EXTERNAL
Calcium, External: 9.7
Carbon Dioxide (Total), External: 29
Chloride, External: 104
Creatinine, External: 1.5
Glucose, External: 95
Potassium, External: 4.4
Sodium, External: 141
Urea Nitrogen, External: 32

## 2009-10-30 LAB — CBC (HEMOGRAM), EXTERNAL
Hematocrit, External: 36.5
Hemoglobin, External: 12.2
MCV, External: 86.6
Platelet Count, External: 269
WBC, External: 4.93

## 2009-10-30 LAB — HEPATIC FUNCTION PANEL, EXTERNAL
ALT (GPT), External: 35
AST (GOT), External: 28
Albumin, External: 4.3
Alkaline Phosphatase (Total), External: 148
Bilirubin (Total), External: 0.5
Protein (Total), External: 7.3

## 2009-10-30 LAB — URIC ACID, EXTERNAL: Uric Acid, External: 6.2

## 2009-10-30 LAB — CBC, DIFF, EXTERNAL: Neutrophils, External: 3.33

## 2009-10-30 LAB — COMPREHENSIVE METABOLIC PANEL, EXTERNAL: GFR, Calc, European American, External: 37

## 2009-10-30 LAB — GAMMA GLUTAMYL TRANSFERASE, EXTERNAL: Gamma Glutamyl Transferase, External: 43

## 2009-10-30 LAB — MAGNESIUM, EXTERNAL: Magnesium, External: 2

## 2009-10-30 LAB — TACROLIMUS, EXTERNAL: Tacrolimus, External: 3

## 2009-11-30 ENCOUNTER — Other Ambulatory Visit: Payer: Self-pay

## 2009-12-07 ENCOUNTER — Other Ambulatory Visit: Payer: Self-pay

## 2010-01-13 ENCOUNTER — Other Ambulatory Visit (HOSPITAL_COMMUNITY): Payer: Self-pay

## 2010-01-13 LAB — BASIC METABOLIC PANEL, EXTERNAL
Calcium, External: 10.1
Carbon Dioxide (Total), External: 30
Chloride, External: 103
Creatinine, External: 1.7
Glucose, External: 94
Potassium, External: 4.3
Sodium, External: 140
Urea Nitrogen, External: 34

## 2010-01-13 LAB — HEPATIC FUNCTION PANEL, EXTERNAL
ALT (GPT), External: 37
AST (GOT), External: 27
Albumin, External: 4.5
Alkaline Phosphatase (Total), External: 116
Bilirubin (Total), External: 0.4
Protein (Total), External: 7.5

## 2010-01-13 LAB — CBC, DIFF, EXTERNAL: Neutrophils, External: 2.45

## 2010-01-13 LAB — CBC (HEMOGRAM), EXTERNAL
Hematocrit, External: 38.8
Hemoglobin, External: 12.6
MCV, External: 86.6
Platelet Count, External: 285
WBC, External: 3.89

## 2010-01-13 LAB — GAMMA GLUTAMYL TRANSFERASE, EXTERNAL: Gamma Glutamyl Transferase, External: 30

## 2010-01-13 LAB — COMPREHENSIVE METABOLIC PANEL, EXTERNAL: GFR, Calc, European American, External: 32

## 2010-01-13 LAB — MAGNESIUM, EXTERNAL: Magnesium, External: 2

## 2010-01-13 LAB — URIC ACID, EXTERNAL: Uric Acid, External: 6.2

## 2010-02-04 ENCOUNTER — Other Ambulatory Visit: Payer: Self-pay

## 2010-03-05 ENCOUNTER — Other Ambulatory Visit: Payer: Self-pay

## 2010-04-04 ENCOUNTER — Other Ambulatory Visit: Payer: Self-pay

## 2010-04-05 ENCOUNTER — Other Ambulatory Visit: Payer: Self-pay

## 2010-04-06 ENCOUNTER — Other Ambulatory Visit: Payer: Self-pay

## 2010-04-07 ENCOUNTER — Other Ambulatory Visit: Payer: Self-pay

## 2010-04-08 ENCOUNTER — Other Ambulatory Visit: Payer: Self-pay

## 2010-04-09 ENCOUNTER — Other Ambulatory Visit: Payer: Self-pay

## 2010-04-10 ENCOUNTER — Other Ambulatory Visit: Payer: Self-pay

## 2010-04-11 ENCOUNTER — Other Ambulatory Visit: Payer: Self-pay

## 2010-04-12 ENCOUNTER — Other Ambulatory Visit: Payer: Self-pay

## 2010-04-13 ENCOUNTER — Other Ambulatory Visit: Payer: Self-pay

## 2010-04-14 ENCOUNTER — Other Ambulatory Visit (HOSPITAL_COMMUNITY): Payer: Self-pay

## 2010-04-14 ENCOUNTER — Other Ambulatory Visit: Payer: Self-pay

## 2010-04-14 LAB — BASIC METABOLIC PANEL, EXTERNAL
Calcium, External: 9.8
Carbon Dioxide (Total), External: 31
Chloride, External: 104
Creatinine, External: 1.3
Glucose, External: 91
Potassium, External: 3.9
Sodium, External: 142
Urea Nitrogen, External: 29

## 2010-04-14 LAB — HEPATIC FUNCTION PANEL, EXTERNAL
ALT (GPT), External: 74
AST (GOT), External: 50
Albumin, External: 4.5
Alkaline Phosphatase (Total), External: 118
Bilirubin (Total), External: 0.4
Protein (Total), External: 7.5

## 2010-04-14 LAB — CBC (HEMOGRAM), EXTERNAL
Hematocrit, External: 39.9
Hemoglobin, External: 12.8
MCV, External: 89.7
Platelet Count, External: 278
WBC, External: 4.51

## 2010-04-14 LAB — MAGNESIUM, EXTERNAL: Magnesium, External: 1.9

## 2010-04-14 LAB — URIC ACID, EXTERNAL: Uric Acid, External: 5.5

## 2010-04-14 LAB — COMPREHENSIVE METABOLIC PANEL, EXTERNAL: GFR, Calc, European American, External: 43

## 2010-04-14 LAB — GAMMA GLUTAMYL TRANSFERASE, EXTERNAL: Gamma Glutamyl Transferase, External: 38

## 2010-04-14 LAB — TACROLIMUS, EXTERNAL: Tacrolimus, External: 3

## 2010-04-15 ENCOUNTER — Other Ambulatory Visit: Payer: Self-pay

## 2010-04-16 ENCOUNTER — Other Ambulatory Visit: Payer: Self-pay

## 2010-04-17 ENCOUNTER — Other Ambulatory Visit: Payer: Self-pay

## 2010-04-19 ENCOUNTER — Other Ambulatory Visit: Payer: Self-pay

## 2010-04-20 ENCOUNTER — Other Ambulatory Visit: Payer: Self-pay

## 2010-04-22 ENCOUNTER — Other Ambulatory Visit: Payer: Self-pay

## 2010-04-23 ENCOUNTER — Other Ambulatory Visit: Payer: Self-pay

## 2010-04-24 ENCOUNTER — Other Ambulatory Visit: Payer: Self-pay

## 2010-04-25 ENCOUNTER — Other Ambulatory Visit: Payer: Self-pay

## 2010-04-26 ENCOUNTER — Other Ambulatory Visit: Payer: Self-pay

## 2010-04-27 ENCOUNTER — Other Ambulatory Visit: Payer: Self-pay

## 2010-04-27 ENCOUNTER — Other Ambulatory Visit (HOSPITAL_COMMUNITY): Payer: Self-pay

## 2010-04-27 LAB — HEPATIC FUNCTION PANEL, EXTERNAL
ALT (GPT), External: 43
AST (GOT), External: 35
Albumin, External: 4.4
Alkaline Phosphatase (Total), External: 118
Bilirubin (Direct), External: 0.1
Bilirubin (Total), External: 0.5
Protein (Total), External: 7.4

## 2010-04-28 ENCOUNTER — Other Ambulatory Visit: Payer: Self-pay

## 2010-04-29 ENCOUNTER — Other Ambulatory Visit: Payer: Self-pay

## 2010-04-30 ENCOUNTER — Other Ambulatory Visit: Payer: Self-pay

## 2010-05-01 ENCOUNTER — Other Ambulatory Visit: Payer: Self-pay

## 2010-05-02 ENCOUNTER — Other Ambulatory Visit: Payer: Self-pay

## 2010-06-30 ENCOUNTER — Other Ambulatory Visit (HOSPITAL_COMMUNITY): Payer: Self-pay

## 2010-06-30 LAB — BASIC METABOLIC PANEL, EXTERNAL
Calcium, External: 9.5
Carbon Dioxide (Total), External: 32
Chloride, External: 107
Creatinine, External: 1.3
Glucose, External: 96
Potassium, External: 4.2
Sodium, External: 143
Urea Nitrogen, External: 31

## 2010-06-30 LAB — CBC (HEMOGRAM), EXTERNAL
Hematocrit, External: 37.9
Hemoglobin, External: 11.9
MCV, External: 90.1
Platelet Count, External: 235
WBC, External: 4.51

## 2010-06-30 LAB — CBC, DIFF, EXTERNAL: Neutrophils, External: 3.04

## 2010-06-30 LAB — URIC ACID, EXTERNAL: Uric Acid, External: 5.4

## 2010-06-30 LAB — HEPATIC FUNCTION PANEL, EXTERNAL
ALT (GPT), External: 45
AST (GOT), External: 39
Albumin, External: 4.3
Alkaline Phosphatase (Total), External: 127
Bilirubin (Total), External: 0.4
Protein (Total), External: 7.2

## 2010-06-30 LAB — COMPREHENSIVE METABOLIC PANEL, EXTERNAL: GFR, Calc, European American, External: 43

## 2010-06-30 LAB — MAGNESIUM, EXTERNAL: Magnesium, External: 2.1

## 2010-06-30 LAB — TACROLIMUS, EXTERNAL: Tacrolimus, External: 2

## 2010-06-30 LAB — GAMMA GLUTAMYL TRANSFERASE, EXTERNAL: Gamma Glutamyl Transferase, External: 37

## 2010-08-06 ENCOUNTER — Other Ambulatory Visit (HOSPITAL_COMMUNITY): Payer: Self-pay

## 2010-08-06 LAB — CBC (HEMOGRAM), EXTERNAL
Hematocrit, External: 37.9
Hemoglobin, External: 12.2
MCV, External: 88.3
Platelet Count, External: 252
WBC, External: 4.87

## 2010-08-06 LAB — BASIC METABOLIC PANEL, EXTERNAL
Calcium, External: 9.5
Carbon Dioxide (Total), External: 31
Chloride, External: 102
Creatinine, External: 1.4
Glucose, External: 87
Potassium, External: 3.9
Sodium, External: 140
Urea Nitrogen, External: 32

## 2010-08-06 LAB — HEPATIC FUNCTION PANEL, EXTERNAL
ALT (GPT), External: 31
AST (GOT), External: 28
Albumin, External: 4.3
Alkaline Phosphatase (Total), External: 116
Bilirubin (Total), External: 0.4
Protein (Total), External: 7.2

## 2010-08-06 LAB — GAMMA GLUTAMYL TRANSFERASE, EXTERNAL: Gamma Glutamyl Transferase, External: 33

## 2010-08-06 LAB — CBC, DIFF, EXTERNAL: Neutrophils, External: 3.5

## 2010-08-06 LAB — MAGNESIUM, EXTERNAL: Magnesium, External: 2

## 2010-08-06 LAB — URIC ACID, EXTERNAL: Uric Acid, External: 5.8

## 2010-08-06 LAB — TACROLIMUS, EXTERNAL: Tacrolimus, External: 2.9

## 2010-08-06 LAB — COMPREHENSIVE METABOLIC PANEL, EXTERNAL: GFR, Calc, European American, External: 40

## 2010-10-20 ENCOUNTER — Other Ambulatory Visit (HOSPITAL_COMMUNITY): Payer: Self-pay

## 2010-10-20 LAB — HEPATIC FUNCTION PANEL, EXTERNAL
ALT (GPT), External: 35
AST (GOT), External: 27
Albumin, External: 4.5
Alkaline Phosphatase (Total), External: 96
Bilirubin (Total), External: 0.5
Protein (Total), External: 7.3

## 2010-10-20 LAB — CBC (HEMOGRAM), EXTERNAL
Hematocrit, External: 39.9
Hemoglobin, External: 12.5
MCH, External: 27.9
MCV, External: 89.2
Platelet Count, External: 264
WBC, External: 3.79

## 2010-10-20 LAB — BASIC METABOLIC PANEL, EXTERNAL
Calcium, External: 9.7
Carbon Dioxide (Total), External: 30
Chloride, External: 105
Creatinine, External: 1.4
Glucose, External: 106
Potassium, External: 4.2
Sodium, External: 141
Urea Nitrogen, External: 35

## 2010-10-20 LAB — MAGNESIUM, EXTERNAL: Magnesium, External: 1.9

## 2010-10-20 LAB — URIC ACID, EXTERNAL: Uric Acid, External: 5.9

## 2010-10-20 LAB — TACROLIMUS, EXTERNAL: Tacrolimus, External: 3.1

## 2010-10-20 LAB — CBC, DIFF, EXTERNAL: Neutrophils, External: 2.66

## 2010-10-20 LAB — GAMMA GLUTAMYL TRANSFERASE, EXTERNAL: Gamma Glutamyl Transferase, External: 30

## 2010-10-20 LAB — COMPREHENSIVE METABOLIC PANEL, EXTERNAL: GFR, Calc, European American, External: 40

## 2011-02-22 ENCOUNTER — Other Ambulatory Visit (HOSPITAL_COMMUNITY): Payer: Self-pay

## 2011-02-22 LAB — PHOSPHATE, EXTERNAL: Phosphate, External: 3.6

## 2011-02-22 LAB — VITAMIN D (25 HYDROXY), EXTERNAL: Vit D (25_Hydroxy) Total, External: 31

## 2011-02-22 LAB — CBC, DIFF, EXTERNAL
% Basophils, External: 0.4
% Eosinophils, External: 6.8
% Lymphocytes, External: 17.9
% Monocytes, External: 5.1
% Neutrophils, External: 68.8
Basophils, External: 0.02
Eosinophil Count, External: 0.34
Lymphocyte Count, External: 0.89
Monocytes, External: 0.25
Neutrophils, External: 3.41

## 2011-02-22 LAB — CREATININE CLEARANCE, URN, EXTERNAL
Creatinine Clearance, External: 34
Total Volume, External: 3350

## 2011-02-22 LAB — HEPATIC FUNCTION PANEL, EXTERNAL
ALT (GPT), External: 45
AST (GOT), External: 38
Albumin, External: 4.5
Alkaline Phosphatase (Total), External: 125
Bilirubin (Direct), External: 0.2
Bilirubin (Total), External: 0.5
Protein (Total), External: 7.3

## 2011-02-22 LAB — BASIC METABOLIC PANEL, EXTERNAL
Calcium, External: 9.6
Carbon Dioxide (Total), External: 30
Chloride, External: 103
Creatinine, External: 1.3
Glucose, External: 91
Potassium, External: 3.8
Sodium, External: 141
Urea Nitrogen, External: 30

## 2011-02-22 LAB — PROTHROMBIN AND PTT, EXTERNAL
Prothrombin INR, External: 0.9
Prothrombin Time Patient, External: 11.9

## 2011-02-22 LAB — CBC (HEMOGRAM), EXTERNAL
Hematocrit, External: 39.6
Hemoglobin, External: 12.4
MCH, External: 28.3
MCV, External: 90.2
Platelet Count, External: 251
WBC, External: 4.95

## 2011-02-22 LAB — PROTEIN/CREATININE RATIO, URN, EXTERNAL
Creatinine/24hr, URN, External: 771
Creatinine/Unit, URN, External: 23
Protein (Total)/24 hr, URN, External: 101
Protein/Creatinine Ratio, External: 0.1

## 2011-02-22 LAB — MAGNESIUM, EXTERNAL: Magnesium, External: 2

## 2011-02-22 LAB — LIPID PANEL, EXTERNAL
Cholesterol (HDL), External: 63
Cholesterol (LDL), External: 127
Cholesterol (Total), External: 211
Triglyceride, External: 106

## 2011-02-22 LAB — GAMMA GLUTAMYL TRANSFERASE, EXTERNAL: Gamma Glutamyl Transferase, External: 211

## 2011-02-22 LAB — TACROLIMUS, EXTERNAL: Tacrolimus, External: 3.6

## 2011-02-22 LAB — COMPREHENSIVE METABOLIC PANEL, EXTERNAL: GFR, Calc, European American, External: 43

## 2011-02-22 LAB — SIROLIMUS, EXTERNAL: Sirolimus, External: <2

## 2011-02-22 LAB — CYCLOSPORINE, EXTERNAL: Cyclosporine By LC_MS, External: <15

## 2011-02-26 ENCOUNTER — Ambulatory Visit: Payer: Medicare Other | Attending: Gastroenterology

## 2011-02-26 DIAGNOSIS — N189 Chronic kidney disease, unspecified: Secondary | ICD-10-CM | POA: Insufficient documentation

## 2011-02-26 DIAGNOSIS — K745 Biliary cirrhosis, unspecified: Secondary | ICD-10-CM | POA: Insufficient documentation

## 2011-02-26 DIAGNOSIS — Z944 Liver transplant status: Secondary | ICD-10-CM | POA: Insufficient documentation

## 2011-05-19 ENCOUNTER — Other Ambulatory Visit (HOSPITAL_COMMUNITY): Payer: Self-pay

## 2011-05-19 LAB — CBC, DIFF, EXTERNAL
% Basophils, External: 0.3
% Eosinophils, External: 7.7
% Lymphocytes, External: 18.4
% Monocytes, External: 6.1
% Neutrophils, External: 65.9
Basophils, External: 0.02
Eosinophil Count, External: 0.36
Lymphocyte Count, External: 0.85
Monocytes, External: 0.28
Neutrophils, External: 3.04

## 2011-05-19 LAB — HEPATIC FUNCTION PANEL, EXTERNAL
ALT (GPT), External: 43
AST (GOT), External: 29
Albumin, External: 4.5
Alkaline Phosphatase (Total), External: 112
Bilirubin (Total), External: 0.5
Protein (Total), External: 7.1

## 2011-05-19 LAB — BASIC METABOLIC PANEL, EXTERNAL
Calcium, External: 9.6
Carbon Dioxide (Total), External: 31
Chloride, External: 103
Creatinine, External: 1.4
Glucose, External: 94
Potassium, External: 4.7
Sodium, External: 145
Urea Nitrogen, External: 33

## 2011-05-19 LAB — GAMMA GLUTAMYL TRANSFERASE, EXTERNAL: Gamma Glutamyl Transferase, External: 24

## 2011-05-19 LAB — COMPREHENSIVE METABOLIC PANEL, EXTERNAL: GFR, Calc, European American, External: 40

## 2011-05-19 LAB — TACROLIMUS, EXTERNAL: Tacrolimus, External: 2.8

## 2011-05-19 LAB — CBC (HEMOGRAM), EXTERNAL
Hematocrit, External: 37.7
Hemoglobin, External: 11.8
MCH, External: 27.9
MCV, External: 89.4
Platelet Count, External: 234
WBC, External: 4.62

## 2011-05-19 LAB — MAGNESIUM, EXTERNAL: Magnesium, External: 2

## 2011-05-19 LAB — URIC ACID, EXTERNAL: Uric Acid, External: 6.2

## 2011-06-30 ENCOUNTER — Other Ambulatory Visit (HOSPITAL_COMMUNITY): Payer: Self-pay

## 2011-06-30 LAB — BASIC METABOLIC PANEL, EXTERNAL
Calcium, External: 9.6
Carbon Dioxide (Total), External: 29
Chloride, External: 105
Creatinine, External: 1.4
Glucose, External: 89
Potassium, External: 4.1
Sodium, External: 142
Urea Nitrogen, External: 34

## 2011-06-30 LAB — MAGNESIUM, EXTERNAL: Magnesium, External: 2

## 2011-06-30 LAB — URIC ACID, EXTERNAL: Uric Acid, External: 5.8

## 2011-06-30 LAB — CBC (HEMOGRAM), EXTERNAL
Hematocrit, External: 39.3
Hemoglobin, External: 12.8
MCH, External: 28.5
MCV, External: 86.9
Platelet Count, External: 267
WBC, External: 4.85

## 2011-06-30 LAB — TACROLIMUS, EXTERNAL: Tacrolimus, External: 3.3

## 2011-06-30 LAB — CBC, DIFF, EXTERNAL
% Basophils, External: 0.8
% Eosinophils, External: 8.8
% Lymphocytes, External: 18
% Monocytes, External: 5.7
% Neutrophils, External: 65
Basophils, External: 0.04
Eosinophil Count, External: 0.42
Lymphocyte Count, External: 0.87
Monocytes, External: 0.28
Neutrophils, External: 3.15

## 2011-06-30 LAB — HEPATIC FUNCTION PANEL, EXTERNAL
ALT (GPT), External: 43
AST (GOT), External: 36
Albumin, External: 4.4
Alkaline Phosphatase (Total), External: 113
Bilirubin (Total), External: 0.5
Protein (Total), External: 7.2

## 2011-06-30 LAB — GAMMA GLUTAMYL TRANSFERASE, EXTERNAL: Gamma Glutamyl Transferase, External: 32

## 2011-06-30 LAB — COMPREHENSIVE METABOLIC PANEL, EXTERNAL: GFR, Calc, European American, External: 40

## 2011-09-08 ENCOUNTER — Other Ambulatory Visit (HOSPITAL_COMMUNITY): Payer: Self-pay

## 2011-09-08 LAB — HEPATIC FUNCTION PANEL, EXTERNAL
ALT (GPT), External: 46
AST (GOT), External: 34
Albumin, External: 4.3
Alkaline Phosphatase (Total), External: 119
Bilirubin (Total), External: 0.5
Protein (Total), External: 7.2

## 2011-09-08 LAB — CBC, DIFF, EXTERNAL
% Basophils, External: 0.4
% Eosinophils, External: 8.5
% Lymphocytes, External: 16.8
% Monocytes, External: 4.3
% Neutrophils, External: 69
Basophils, External: 0.02
Eosinophil Count, External: 0.5
Lymphocyte Count, External: 1
Monocytes, External: 0.26
Neutrophils, External: 4.11

## 2011-09-08 LAB — BASIC METABOLIC PANEL, EXTERNAL
Calcium, External: 9.7
Carbon Dioxide (Total), External: 30
Chloride, External: 104
Creatinine, External: 1.4
Glucose, External: 97
Potassium, External: 4.1
Sodium, External: 143
Urea Nitrogen, External: 30

## 2011-09-08 LAB — MAGNESIUM, EXTERNAL: Magnesium, External: 2

## 2011-09-08 LAB — TACROLIMUS, EXTERNAL: Tacrolimus, External: 3.2

## 2011-09-08 LAB — CBC (HEMOGRAM), EXTERNAL
Hematocrit, External: 37.7
Hemoglobin, External: 12.5
MCH, External: 29
MCV, External: 87.4
Platelet Count, External: 206
WBC, External: 5.95

## 2011-09-08 LAB — COMPREHENSIVE METABOLIC PANEL, EXTERNAL: GFR, Calc, European American, External: 40

## 2011-09-08 LAB — URIC ACID, EXTERNAL: Uric Acid, External: 6.1

## 2011-09-08 LAB — GAMMA GLUTAMYL TRANSFERASE, EXTERNAL: Gamma Glutamyl Transferase, External: 31

## 2011-11-09 ENCOUNTER — Other Ambulatory Visit (HOSPITAL_COMMUNITY): Payer: Self-pay

## 2011-11-09 LAB — CBC, DIFF, EXTERNAL
% Basophils, External: 0.6
% Eosinophils, External: 10
% Lymphocytes, External: 17.5
% Monocytes, External: 5.5
% Neutrophils, External: 65.6
Basophils, External: 0.03
Eosinophil Count, External: 0.47
Lymphocyte Count, External: 0.82
Monocytes, External: 0.26
Neutrophils, External: 3.08

## 2011-11-09 LAB — BASIC METABOLIC PANEL, EXTERNAL
Calcium, External: 9.7
Carbon Dioxide (Total), External: 28
Chloride, External: 102
Creatinine, External: 1.3
Glucose, External: 92
Potassium, External: 4.1
Sodium, External: 142
Urea Nitrogen, External: 32

## 2011-11-09 LAB — HEPATIC FUNCTION PANEL, EXTERNAL
ALT (GPT), External: 65
AST (GOT), External: 44
Albumin, External: 4.4
Alkaline Phosphatase (Total), External: 134
Bilirubin (Total), External: 0.4
Protein (Total), External: 7.3

## 2011-11-09 LAB — MAGNESIUM, EXTERNAL: Magnesium, External: 1.9

## 2011-11-09 LAB — CBC (HEMOGRAM), EXTERNAL
Hematocrit, External: 38.5
Hemoglobin, External: 12.5
MCH, External: 28.8
MCV, External: 88.8
Platelet Count, External: 236
WBC, External: 4.69

## 2011-11-09 LAB — GAMMA GLUTAMYL TRANSFERASE, EXTERNAL: Gamma Glutamyl Transferase, External: 36

## 2011-11-09 LAB — URIC ACID, EXTERNAL: Uric Acid, External: 5

## 2011-11-09 LAB — TACROLIMUS, EXTERNAL: Tacrolimus, External: 3.9

## 2011-11-09 LAB — COMPREHENSIVE METABOLIC PANEL, EXTERNAL: GFR, Calc, European American, External: 43

## 2012-01-07 ENCOUNTER — Other Ambulatory Visit (HOSPITAL_COMMUNITY): Payer: Self-pay

## 2012-01-07 LAB — CBC (HEMOGRAM), EXTERNAL
Hematocrit, External: 37.4
Hemoglobin, External: 12.6
MCH, External: 29.4
MCV, External: 87.4
Platelet Count, External: 223
WBC, External: 5.09

## 2012-01-07 LAB — BASIC METABOLIC PANEL, EXTERNAL
Calcium, External: 9.6
Carbon Dioxide (Total), External: 30
Chloride, External: 104
Creatinine, External: 1.7
Glucose, External: 85
Potassium, External: 4.3
Sodium, External: 143
Urea Nitrogen, External: 39

## 2012-01-07 LAB — MAGNESIUM, EXTERNAL: Magnesium, External: 2

## 2012-01-07 LAB — URIC ACID, EXTERNAL: Uric Acid, External: 6.4

## 2012-01-07 LAB — HEPATIC FUNCTION PANEL, EXTERNAL
ALT (GPT), External: 46
AST (GOT), External: 42
Albumin, External: 4.4
Alkaline Phosphatase (Total), External: 126
Bilirubin (Total), External: 0.5
Protein (Total), External: 7.1

## 2012-01-07 LAB — GAMMA GLUTAMYL TRANSFERASE, EXTERNAL: Gamma Glutamyl Transferase, External: 32

## 2012-01-07 LAB — CBC, DIFF, EXTERNAL
% Basophils, External: 0.7
% Eosinophils, External: 11.2
% Lymphocytes, External: 19.8
% Monocytes, External: 5.9
% Neutrophils, External: 61.5
Basophils, External: 0.04
Eosinophil Count, External: 0.57
Lymphocyte Count, External: 1.01
Monocytes, External: 0.3
Neutrophils, External: 3.13

## 2012-01-07 LAB — TACROLIMUS, EXTERNAL: Tacrolimus, External: 3.6

## 2012-01-07 LAB — COMPREHENSIVE METABOLIC PANEL, EXTERNAL: GFR, Calc, European American, External: 32

## 2012-03-31 ENCOUNTER — Ambulatory Visit: Payer: Medicare Other | Attending: Gastroenterology

## 2012-03-31 DIAGNOSIS — Z944 Liver transplant status: Secondary | ICD-10-CM | POA: Insufficient documentation

## 2012-03-31 DIAGNOSIS — K745 Biliary cirrhosis, unspecified: Secondary | ICD-10-CM | POA: Insufficient documentation

## 2012-05-23 ENCOUNTER — Other Ambulatory Visit (HOSPITAL_COMMUNITY): Payer: Self-pay

## 2012-05-23 LAB — CBC, DIFF, EXTERNAL
% Basophils, External: 2.2
% Eosinophils, External: 9.1
% Lymphocytes, External: 17
% Monocytes, External: 6.3
% Neutrophils, External: 64.5
Basophils, External: 0.14
Eosinophil Count, External: 0.56
Lymphocyte Count, External: 1.04
Monocytes, External: 0.38
Neutrophils, External: 3.94

## 2012-05-23 LAB — HEPATIC FUNCTION PANEL, EXTERNAL
ALT (GPT), External: 35
AST (GOT), External: 32
Albumin, External: 4.5
Alkaline Phosphatase (Total), External: 118
Bilirubin (Total), External: 0.5
Protein (Total), External: 7.3

## 2012-05-23 LAB — CBC (HEMOGRAM), EXTERNAL
Hematocrit, External: 39.1
Hemoglobin, External: 12.8
MCH, External: 28.7
MCV, External: 87.1
Platelet Count, External: 239
WBC, External: 6.11

## 2012-05-23 LAB — LIPID PANEL, EXTERNAL
Cholesterol (HDL), External: 65
Cholesterol (LDL), External: 71
Cholesterol (Total), External: 155
Triglyceride, External: 96

## 2012-05-23 LAB — BASIC METABOLIC PANEL, EXTERNAL
Calcium, External: 9.6
Carbon Dioxide (Total), External: 29
Chloride, External: 103
Creatinine, External: 1.3
Glucose, External: 97
Potassium, External: 3.9
Sodium, External: 141
Urea Nitrogen, External: 32

## 2012-05-23 LAB — PROTHROMBIN AND PTT, EXTERNAL
Prothrombin INR, External: 0.9
Prothrombin Time Patient, External: 12

## 2012-05-23 LAB — TACROLIMUS, EXTERNAL: Tacrolimus, External: 4.2

## 2012-05-23 LAB — MAGNESIUM, EXTERNAL: Magnesium, External: 1.8

## 2012-05-23 LAB — PHOSPHATE, EXTERNAL: Phosphate, External: 3.4

## 2012-05-23 LAB — COMPREHENSIVE METABOLIC PANEL, EXTERNAL: GFR, Calc, European American, External: 43

## 2012-05-23 LAB — GAMMA GLUTAMYL TRANSFERASE, EXTERNAL: Gamma Glutamyl Transferase, External: 29

## 2012-06-20 ENCOUNTER — Other Ambulatory Visit (HOSPITAL_COMMUNITY): Payer: Self-pay

## 2012-06-20 LAB — URIC ACID, EXTERNAL: Uric Acid, External: 5.7

## 2012-06-20 LAB — CBC (HEMOGRAM), EXTERNAL
Hematocrit, External: 40.7
Hemoglobin, External: 13.2
MCH, External: 28.3
MCV, External: 87.3
Platelet Count, External: 256
WBC, External: 6.51

## 2012-06-20 LAB — TACROLIMUS, EXTERNAL: Tacrolimus, External: 4.2

## 2012-06-20 LAB — CBC, DIFF, EXTERNAL
% Basophils, External: 0.2
% Eosinophils, External: 9.3
% Lymphocytes, External: 15.7
% Monocytes, External: 4.5
% Neutrophils, External: 69.5
Basophils, External: 0.02
Eosinophil Count, External: 0.6
Lymphocyte Count, External: 1.02
Monocytes, External: 0.29
Neutrophils, External: 4.52

## 2012-06-20 LAB — PROTHROMBIN AND PTT, EXTERNAL
Prothrombin INR, External: 0.9
Prothrombin Time Patient, External: 12

## 2012-06-20 LAB — PHOSPHATE, EXTERNAL: Phosphate, External: 3.4

## 2012-06-20 LAB — MAGNESIUM, EXTERNAL: Magnesium, External: 1.8

## 2012-06-20 LAB — LIPID PANEL, EXTERNAL
Cholesterol (HDL), External: 62
Cholesterol (LDL), External: 81
Cholesterol (Total), External: 160
Triglyceride, External: 84

## 2012-06-20 LAB — GAMMA GLUTAMYL TRANSFERASE, EXTERNAL: Gamma Glutamyl Transferase, External: 34

## 2012-07-25 ENCOUNTER — Other Ambulatory Visit (HOSPITAL_COMMUNITY): Payer: Self-pay

## 2012-07-25 LAB — CBC (HEMOGRAM), EXTERNAL
Hematocrit, External: 38.8
Hemoglobin, External: 12.5
MCH, External: 28.3
MCV, External: 88.2
Platelet Count, External: 225
WBC, External: 6.23

## 2012-07-25 LAB — HEPATIC FUNCTION PANEL, EXTERNAL
ALT (GPT), External: 31
AST (GOT), External: 26
Albumin, External: 4.2
Alkaline Phosphatase (Total), External: 127
Bilirubin (Total), External: 0.6
Protein (Total), External: 6.9

## 2012-07-25 LAB — CBC, DIFF, EXTERNAL
% Basophils, External: 0.7
% Eosinophils, External: 9.4
% Lymphocytes, External: 16.8
% Monocytes, External: 4.5
% Neutrophils, External: 67.7
Basophils, External: 0.05
Eosinophil Count, External: 0.58
Lymphocyte Count, External: 1.04
Monocytes, External: 0.28
Neutrophils, External: 4.22

## 2012-07-25 LAB — GAMMA GLUTAMYL TRANSFERASE, EXTERNAL: Gamma Glutamyl Transferase, External: 36

## 2012-07-25 LAB — BASIC METABOLIC PANEL, EXTERNAL
Calcium, External: 9.6
Carbon Dioxide (Total), External: 28
Chloride, External: 103
Creatinine, External: 1.1
Glucose, External: 98
Potassium, External: 4.4
Sodium, External: 138
Urea Nitrogen, External: 31

## 2012-07-25 LAB — MAGNESIUM, EXTERNAL: Magnesium, External: 1.7

## 2012-07-25 LAB — COMPREHENSIVE METABOLIC PANEL, EXTERNAL: GFR, Calc, European American, External: 49

## 2012-07-25 LAB — TACROLIMUS, EXTERNAL: Tacrolimus, External: 4.3

## 2012-08-21 ENCOUNTER — Other Ambulatory Visit (HOSPITAL_COMMUNITY): Payer: Self-pay

## 2012-08-21 LAB — LIPID PANEL, EXTERNAL
Cholesterol (HDL), External: 63
Cholesterol (LDL), External: 74
Cholesterol (Total), External: 153
Triglyceride, External: 82

## 2012-08-21 LAB — HEPATIC FUNCTION PANEL, EXTERNAL
ALT (GPT), External: 28
AST (GOT), External: 28
Albumin, External: 4.3
Alkaline Phosphatase (Total), External: 123
Bilirubin (Total), External: 0.5
Protein (Total), External: 6.4

## 2012-08-21 LAB — BASIC METABOLIC PANEL, EXTERNAL
Calcium, External: 9.4
Carbon Dioxide (Total), External: 29
Chloride, External: 103
Creatinine, External: 1.1
Glucose, External: 95
Potassium, External: 4.2
Sodium, External: 139
Urea Nitrogen, External: 27

## 2012-08-21 LAB — TACROLIMUS, EXTERNAL: Tacrolimus, External: 5.1

## 2012-08-21 LAB — CBC, DIFF, EXTERNAL
% Basophils, External: 0.9
% Eosinophils, External: 10.7
% Lymphocytes, External: 16.8
% Monocytes, External: 4.4
% Neutrophils, External: 66.1
Basophils, External: 0.05
Eosinophil Count, External: 0.6
Lymphocyte Count, External: 0.94
Monocytes, External: 0.25
Neutrophils, External: 3.71

## 2012-08-21 LAB — COMPREHENSIVE METABOLIC PANEL, EXTERNAL: GFR, Calc, European American, External: 49

## 2012-08-21 LAB — CBC (HEMOGRAM), EXTERNAL
Hematocrit, External: 38.6
Hemoglobin, External: 12.8
MCH, External: 28.9
MCV, External: 86.9
Platelet Count, External: 248
WBC, External: 5.61

## 2012-08-21 LAB — PROTHROMBIN AND PTT, EXTERNAL
Prothrombin INR, External: 0.9
Prothrombin Time Patient, External: 12.4

## 2012-08-21 LAB — URIC ACID, EXTERNAL: Uric Acid, External: 5.6

## 2012-08-21 LAB — MAGNESIUM, EXTERNAL: Magnesium, External: 1.7

## 2012-08-21 LAB — GAMMA GLUTAMYL TRANSFERASE, EXTERNAL: Gamma Glutamyl Transferase, External: 38

## 2012-08-21 LAB — PHOSPHATE, EXTERNAL: Phosphate, External: 3.4

## 2012-08-23 ENCOUNTER — Other Ambulatory Visit: Payer: Self-pay

## 2012-09-20 ENCOUNTER — Other Ambulatory Visit (HOSPITAL_COMMUNITY): Payer: Self-pay

## 2012-09-20 LAB — CBC (HEMOGRAM), EXTERNAL
Hematocrit, External: 38.8
Hemoglobin, External: 12.9
MCH, External: 28.7
MCV, External: 86.2
Platelet Count, External: 250
WBC, External: 5.58

## 2012-09-20 LAB — CBC, DIFF, EXTERNAL
% Basophils, External: 0.7
% Eosinophils, External: 10.1
% Lymphocytes, External: 17.4
% Monocytes, External: 5
% Neutrophils, External: 65.5
Basophils, External: 0.04
Eosinophil Count, External: 0.56
Lymphocyte Count, External: 0.97
Monocytes, External: 0.28
Neutrophils, External: 3.65

## 2012-09-20 LAB — HEPATIC FUNCTION PANEL, EXTERNAL
ALT (GPT), External: 27
AST (GOT), External: 24
Albumin, External: 4.2
Alkaline Phosphatase (Total), External: 114
Bilirubin (Total), External: 0.6
Protein (Total), External: 6.6

## 2012-09-20 LAB — LIPID PANEL, EXTERNAL
Cholesterol (HDL), External: 51
Cholesterol (LDL), External: 86
Cholesterol (Total), External: 158
Triglyceride, External: 106

## 2012-09-20 LAB — BASIC METABOLIC PANEL, EXTERNAL
Calcium, External: 9.6
Carbon Dioxide (Total), External: 27
Chloride, External: 104
Creatinine, External: 1.2
Glucose, External: 94
Potassium, External: 3.8
Sodium, External: 139
Urea Nitrogen, External: 28

## 2012-09-20 LAB — PROTHROMBIN AND PTT, EXTERNAL
Prothrombin INR, External: 1
Prothrombin Time Patient, External: 13.1

## 2012-09-20 LAB — TACROLIMUS, EXTERNAL: Tacrolimus, External: 4.7

## 2012-09-20 LAB — COMPREHENSIVE METABOLIC PANEL, EXTERNAL: GFR, Calc, European American, External: 44

## 2012-09-20 LAB — GAMMA GLUTAMYL TRANSFERASE, EXTERNAL: Gamma Glutamyl Transferase, External: 33

## 2012-09-20 LAB — MAGNESIUM, EXTERNAL: Magnesium, External: 1.8

## 2012-09-20 LAB — PHOSPHATE, EXTERNAL: Phosphate, External: 3.2

## 2012-09-20 LAB — URIC ACID, EXTERNAL: Uric Acid, External: 5.8

## 2013-04-30 ENCOUNTER — Other Ambulatory Visit (HOSPITAL_COMMUNITY): Payer: Self-pay

## 2013-04-30 LAB — HEPATIC FUNCTION PANEL, EXTERNAL
ALT (GPT), External: 25
AST (GOT), External: 21
Albumin, External: 4.2
Alkaline Phosphatase (Total), External: 102
Bilirubin (Direct), External: 0.1
Bilirubin (Total), External: 0.6
Protein (Total), External: 6.8

## 2013-04-30 LAB — CBC (HEMOGRAM), EXTERNAL
Hematocrit, External: 38.1
Hemoglobin, External: 12.5
MCH, External: 28.7
MCV, External: 87.5
Platelet Count, External: 259
WBC, External: 6.46

## 2013-04-30 LAB — PROTHROMBIN AND PTT, EXTERNAL
Prothrombin INR, External: 0.9
Prothrombin Time Patient, External: 12

## 2013-04-30 LAB — CBC, DIFF, EXTERNAL
% Basophils, External: 8.1
% Eosinophils, External: 4
% Lymphocytes, External: 17.6
% Monocytes, External: 0.8
% Neutrophils, External: 69.2
Basophils, External: 0.03
Eosinophil Count, External: 0.56
Lymphocyte Count, External: 1.13
Monocytes, External: 0.26
Neutrophils, External: 0.4

## 2013-04-30 LAB — MAGNESIUM, EXTERNAL: Magnesium, External: 1.8

## 2013-04-30 LAB — BASIC METABOLIC PANEL, EXTERNAL
Calcium, External: 9.3
Carbon Dioxide (Total), External: 28
Chloride, External: 101
Creatinine, External: 1.2
Glucose, External: 98
Potassium, External: 3.9
Sodium, External: 137
Urea Nitrogen, External: 27

## 2013-04-30 LAB — PROTEIN/CREATININE RATIO, URN, EXTERNAL
Creatinine/24hr, URN, External: 1081
Creatinine/Unit, URN, External: 46
Protein (Total)/24 hr, URN, External: 118

## 2013-04-30 LAB — LIPID PANEL, EXTERNAL
Cholesterol (HDL), External: 62
Cholesterol (LDL), External: 81
Cholesterol (Total), External: 160
Triglyceride, External: 87

## 2013-04-30 LAB — COMPREHENSIVE METABOLIC PANEL, EXTERNAL: GFR, Calc, European American, External: 44

## 2013-04-30 LAB — TACROLIMUS, EXTERNAL: Tacrolimus, External: 5.1

## 2013-04-30 LAB — CREATININE CLEARANCE, URN, EXTERNAL
Creatinine Clearance, External: 53
Total Volume, External: 2350

## 2013-04-30 LAB — GAMMA GLUTAMYL TRANSFERASE, EXTERNAL: Gamma Glutamyl Transferase, External: 33

## 2013-04-30 LAB — VITAMIN D (25 HYDROXY), EXTERNAL: Vit D (25_Hydroxy) Total, External: 39

## 2013-05-30 ENCOUNTER — Encounter (HOSPITAL_BASED_OUTPATIENT_CLINIC_OR_DEPARTMENT_OTHER): Payer: Self-pay

## 2013-06-01 ENCOUNTER — Ambulatory Visit: Payer: Medicare Other | Attending: Registered Nurse | Admitting: Registered Nurse

## 2013-06-01 ENCOUNTER — Encounter (HOSPITAL_BASED_OUTPATIENT_CLINIC_OR_DEPARTMENT_OTHER): Payer: Self-pay

## 2013-06-01 VITALS — BP 149/72 | HR 82 | Temp 98.2°F | Ht 66.0 in | Wt 205.7 lb

## 2013-06-01 DIAGNOSIS — E785 Hyperlipidemia, unspecified: Secondary | ICD-10-CM | POA: Insufficient documentation

## 2013-06-01 DIAGNOSIS — N189 Chronic kidney disease, unspecified: Secondary | ICD-10-CM

## 2013-06-01 DIAGNOSIS — K745 Biliary cirrhosis, unspecified: Secondary | ICD-10-CM | POA: Insufficient documentation

## 2013-06-01 DIAGNOSIS — Z944 Liver transplant status: Secondary | ICD-10-CM

## 2013-06-01 DIAGNOSIS — I10 Essential (primary) hypertension: Secondary | ICD-10-CM | POA: Insufficient documentation

## 2013-06-01 DIAGNOSIS — K743 Primary biliary cirrhosis: Secondary | ICD-10-CM

## 2013-06-01 NOTE — Progress Notes (Signed)
Ebony, Woodard [id number]  Hepatology/Hepatitis - Outpt Record Authenticated      06/01/13  POST LIVER TRANSPLANT FOLLOW-UP NOTE     IDENTIFICATION   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 She is here for routine annual followup.   IINTERVAL HISTORY   She was last seen in the clinic a year ago and is doing very well. On a remote liver biopsy she had evidence of recurrent primary biliary cirrhosis and so is on Ursodiol for that. She did have an improvement in her liver function tests following initiation of Ursodiol around 2010. Today she has no complaints. Her weight has remained stable. She is still enjoying a very active life including traveling to [place], [place] at least once a month to visit with her daughter and twin grandchildren. She is a retried Engineer, site. She is up to date on her health maintenance and recently underwent mammogram. She is up to date on her flu vaccine and gets an annual skin exam.     She has had no hospitalizations, emergency room visits or illnesses over the last year. She continues to take tacrolimus and MMF for her immunosuppression. As is usual for her, her blood pressure is somewhat elevated in the clinic today, although apparently runs about 130/less than 70 at home on a regular basis. She denies any problems with fevers, unintentional weight loss, chest pain, shortness of breath, or urinary problems. She occasionally has some constipation. She is upset that Dr. Clarene Duke is taking a sabbatical as he has been her doctor for 14 years. and she will seeing a new gastroenterologist.    PAST MEDICAL HISTORY   1.   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 Her liver function tests have been stable on Ursodiol.    2.   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    3.   Insulin resistance.    4.   Hypertension.      Current Outpatient Prescriptions   Medication Sig Dispense Refill   . AmLODIPine Besylate (NORVASC) 5 MG Oral Tab Take 5 mg by mouth daily.       . Atorvastatin Calcium 10 MG Oral Tab Take 10 mg by mouth.       . Calcium Carb-Cholecalciferol (CALCIUM 500 +D) 500-400 MG-UNIT Oral Tab Take 1 tablet by mouth 3 times a day.       . Hydrochlorothiazide 12.5 MG Oral Tab        . Mycophenolate Mofetil 250 MG Oral Cap Take 250 mg by mouth 2 times a day.       Marland Kitchen TACROLIMUS OR Take 1 mg by mouth every 12 hours.       . Ursodiol 500 MG Oral Tab          REVIEW OF SYSTEMS   As above.     PHYSICAL EXAMINATION   VITAL SIGNS: Blood pressure 149/72, pulse 82, temperature 98.2 F (36.8 C), temperature source Temporal, height 5\' 6"  (1.676 m), weight 205 lb 11 oz (93.299 kg), SpO2 99 %.  GENERAL: She is a very pleasant, moderately overnourished, well-groomed female in no acute distress. She appears younger than stated age.   HEENT: No scleral icterus or conjunctival injection. She has moist mucous membranes.   CARDIOVASCULAR: Regular rate and rhythm with no murmurs, rubs  or gallops.   RESPIRATORY: Clear to auscultation bilaterally.   ABDOMEN: Soft, nontender, and nondistended with no organomegaly. She has a well-healed surgical incision scar.   NEUROLOGIC: No gross deficits. She ambulates independently and there is no obvious tremor.   SKIN: No rash or jaundice.   EXTREMITIES: Mild nonpitting lower extremity edema.     LABS   04/30/13  WBC 6.4, Hct 38%, PLT 269K, Sodium 139, potassium 3.9, chloride 101, bicarb 30, BUN 27, creatinine 1.2, glucose 101, calcium 9.5. Total protein 6.8, albumin 4.2, total bilirubin 0.6, direct bilirubin 0.2, AST21, ALT 25, alkaline phosphatase 102, magnesium 1.8, phos 3.4, GGT 33, total cholesterol 160, triglycerides 87, HDL 63, LDL 131, vitamin D level is 39, tacrolimus level is 5.1. 24-hour urine volume is 2,350 mL,  creatinine over 24 hours is 1081 mg/24 hours, 53 ml/min. The protein was 74 mg over 24 hours.     04/26/13  2 view Chest X-ray: no acute cardiopulmonary abnormality. .     04/26/13  Ultrasound with Doppler of the abdomen: heterogeneous liver, prominent bowel gas at the porta hepatitis which precluded visualization of the common bile duct. There was no intrahepatic biliary ductal dilatation. The vasculature was normal. .     ASSESSMENT AND PLAN   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   1.   Status post orthotopic liver transplant on immunosuppression. She is on tacrolimus and MMF dual immunosuppression. Her kidney function although abnormal has been stable over the last year with a creatinine of 1.4. She has not had complications related to her immunosuppression such as tremor, headaches or hypertension, although she does tend to have a higher blood pressure when she is in the clinic. She does state that normally her blood pressures tend to run 130/70, but we will ask her local providers to keep a close eye on the blood pressures and treat as needed.    2.   Recurrent primary biliary cirrhosis. She had evidence of recurrent PBC in her allograft around 2010 but was started on Ursodiol at that time and had improvement in her liver tests. Fortunately there was very minimal fibrosis on her last biopsy.    3.   Chronic kidney disease. Again, her creatinine has been stable at 1.4 over the last year.    4.   Healthcare maintenance. She is up to date on her healthcare maintenance and will continue to follow with her primary care provider for regular vaccinations (although she should not receive live vaccines) and cancer screening. She should continue to have annual skin exams as patients on immunosuppression are at higher risk for skin cancers.    We will plan to see her back in 1 year and she  should get labs ever 3 months between now and then. We will not plan for any further liver biopsies unless there are abnormalities in her liver function tests that require further investigation.

## 2014-06-18 ENCOUNTER — Other Ambulatory Visit (HOSPITAL_COMMUNITY): Payer: Self-pay

## 2014-06-18 LAB — PROTHROMBIN AND PTT, EXTERNAL
Prothrombin INR, External: 1
Prothrombin Time Patient, External: 13.2
Prothrombin Time Patient, External: 28.5

## 2014-06-18 LAB — BASIC METABOLIC PANEL, EXTERNAL
Calcium, External: 9.4
Carbon Dioxide (Total), External: 29
Chloride, External: 101
Creatinine, External: 1.1
Glucose, External: 100
Potassium, External: 3.6
Sodium, External: 139
Urea Nitrogen, External: 33

## 2014-06-18 LAB — CBC, DIFF, EXTERNAL
% Basophils, External: 0.7
% Eosinophils, External: 6.7
% Lymphocytes, External: 18.6
% Monocytes, External: 4
% Neutrophils, External: 68.6
Basophils, External: 0.04
Eosinophil Count, External: 0.4
Lymphocyte Count, External: 1.1
Monocytes, External: 0.24
Neutrophils, External: 4.08

## 2014-06-18 LAB — HEPATIC FUNCTION PANEL, EXTERNAL
ALT (GPT), External: 33
AST (GOT), External: 29
Albumin, External: 4.3
Alkaline Phosphatase (Total), External: 117
Bilirubin (Direct), External: 0.2
Bilirubin (Total), External: 0.6
Protein (Total), External: 7.1

## 2014-06-18 LAB — COMPREHENSIVE METABOLIC PANEL, EXTERNAL: GFR, Calc, European American, External: 49

## 2014-06-18 LAB — VITAMIN D (25 HYDROXY), EXTERNAL: Vit D (25_Hydroxy) Total, External: 45

## 2014-06-18 LAB — TACROLIMUS, EXTERNAL: Tacrolimus, External: 5.6

## 2014-06-18 LAB — PHOSPHATE, EXTERNAL: Phosphate, External: 3.3

## 2014-06-18 LAB — LIPID PANEL, EXTERNAL
Cholesterol (HDL), External: 63
Cholesterol (LDL), External: 82
Cholesterol (Total), External: 162
Triglyceride, External: 86

## 2014-06-18 LAB — CBC (HEMOGRAM), EXTERNAL
Hematocrit, External: 37.4
Hemoglobin, External: 12.7
MCH, External: 28.8
MCV, External: 84.4
Platelet Count, External: 251
WBC, External: 5.94

## 2014-06-18 LAB — MAGNESIUM, EXTERNAL: Magnesium, External: 1.8

## 2014-06-18 LAB — GAMMA GLUTAMYL TRANSFERASE, EXTERNAL: Gamma Glutamyl Transferase, External: 36

## 2014-06-20 LAB — PROTEIN/CREATININE RATIO, URN, EXTERNAL
Creatinine/24hr, URN, External: 1075
Protein (Total)/24 hr, URN, External: 175
Protein/Creatinine Ratio, External: 0.2

## 2014-06-20 LAB — CREATININE CLEARANCE, URN, EXTERNAL: Total Volume, External: 2500

## 2014-07-19 ENCOUNTER — Other Ambulatory Visit (HOSPITAL_COMMUNITY): Payer: Self-pay

## 2014-07-19 ENCOUNTER — Ambulatory Visit: Payer: Medicare Other | Attending: Registered Nurse | Admitting: Registered Nurse

## 2014-07-19 ENCOUNTER — Encounter (HOSPITAL_BASED_OUTPATIENT_CLINIC_OR_DEPARTMENT_OTHER): Payer: Self-pay

## 2014-07-19 VITALS — BP 143/69 | HR 85 | Temp 97.9°F | Wt 199.7 lb

## 2014-07-19 DIAGNOSIS — Z23 Encounter for immunization: Secondary | ICD-10-CM | POA: Insufficient documentation

## 2014-07-19 DIAGNOSIS — Z944 Liver transplant status: Secondary | ICD-10-CM

## 2014-07-19 DIAGNOSIS — K743 Primary biliary cirrhosis: Secondary | ICD-10-CM | POA: Insufficient documentation

## 2014-07-19 MED ORDER — TACROLIMUS 0.5 MG OR CAPS
0.5000 mg | ORAL_CAPSULE | ORAL | Status: DC
Start: 2014-07-19 — End: 2015-07-25

## 2014-07-19 NOTE — Progress Notes (Signed)
Vaccine Screening Questions      1.  Have you had a serious reaction or an allergic reaction to a vaccine?  NO    2.  Currently have a moderate or severe illness, including fever? (Don't Ask if vaccine   ordered by provider same day)  NO    3.  Ever had a seizure or a brain or other nervous system problem syndrome associated with a vaccine? (DTaP/TDaP/DTP pertinent) NO    4.  Is patient receiving  any live vaccinations today? (Varicella-Chickenpox, MMR-Measles/Mumps/Rubella, Zoster-Shingles)  NO    If YES to any of the questions above - Do NOT give vaccine.  Consult with RN or provider in clinic.  (#4 can be YES if all Live vaccine questions are answered NO)    If NO to all questions above - Patient may receive vaccine.    5.  Do you need to receive the Flu vaccine today? NO    Vaccine information sheet(s) discussed, patient/parent/guardian verbalized understanding? YES     VIS given 07/19/2014 by Berdine Dance CMA    Vaccine given today without initial adverse effect. YES    Othella Slappey Morton Peters, CMA

## 2014-07-19 NOTE — Patient Instructions (Addendum)
POST LIVER TRANSPLANT CLINIC VISIT SUMMARY    We appreciated seeing you in our Post Liver Transplant Clinic today.    You were seen by:  Hepatology Attending: Estell Harpinharles Landis MD  Constance HolsterMarie Jaylnn Ullery ARNP    Your transplant nurse coordinators are:  Jodell CiproBridget O'Connor, RN  Harless Nakayamaiana Miller, RN  [phone] (phone)  [phone] (fax)    ---------------------------------------------------------------------------------------------------------------------    MEDICATIONS:  Changes to your immunosuppression regimen were made today.    New prescriptions were submitted today.    Your updated liver transplant medications/doses:     Tacrolimus 1 mg in AM and 0.5 mg PM      LABORATORY TESTS:  Please continue getting your blood tests (to include medication trough level, approximately 12 hours after your last dose of immunosuppresion, and before your next dose):     A) You are next due for laboratory testing in: MONTHLY X 3 MONTHS   B) Thereafter, these should be done every 3 MONTHS*, or as directed as your transplant nurse coordinator.   C) New standing orders were given to you (for external lab).    You will receive a letter from our transplant program with your lab results after we receive and review your labs. We will contact you by phone if any medication dose changes or other instructions are needed.  Please contact your transplant nurse coordinator at [phone] if you do not receive this letter a few weeks after your blood tests are done to ensure that we have received them.    As a reminder, please repeat your blood tests within 2 to 4 weeks or as directed by your transplant nurse coordinator after any dose change in your immunosuppression medications.      OTHER RECOMMENDATIONS:    If you get the prevnar 13 now then get a pneumovax in 1 year.    Tdap (Diptheria, pertussis and tetanus) we gave you in clinic today.     FOLLOW-UP:  Please return for a follow-up visit in our Post Liver Transplant Clinic in ! YEAR, when you are  due for your annual tests, to include blood tests, 24 hour urine tests, chest x-ray, and liver ultrasound.    Please contact your transplant nurse coordinators at [phone] with any questions or concerns in the interim.

## 2014-07-19 NOTE — Progress Notes (Signed)
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 Ebony Woodard is here for routine annual followup.     IINTERVAL HISTORY   Ebony Woodard was last seen in the clinic a year ago and is doing very well. Ebony Woodard had a colonscopy last fall in September 2015. Her son and his wife just brought home a newborn who they will foster in hopes of adopting and Ebony Woodard so wants to get the pertussis vaccine. Ebony Woodard had her flu shot in the fall. Ebony Woodard is unsure when her last pneumovax was.    In terms of her medical history Ebony Woodard had evidence of recurrent primary biliary cirrhosis on a remote liver biopsy and so is on Ursodiol for that. Ebony Woodard did have an improvement in her liver function tests following initiation of Ursodiol around 2010. Today Ebony Woodard has no complaints. Her weight has remained stable. Ebony Woodard is still enjoying a very active life including traveling and being with her family. 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 Ebony Woodard is up to date on her health maintenance and recently underwent mammogram. Ebony Woodard gets an annual skin exam.     Ebony Woodard has had no hospitalizations, emergency room visits or illnesses over the last year.  Ebony Woodard denies any problems with fevers, unintentional weight loss, chest pain, shortness of breath, or urinary problems. Ebony Woodard occasionally has some constipation.       PAST MEDICAL HISTORY   1.   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 Ebony Woodard likely has recurrent PBC in her allograft. Her liver function tests have been stable on Ursodiol.    2.   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    3.   Insulin resistance.    4.   Hypertension.      Current Outpatient Prescriptions   Medication Sig Dispense Refill   .  AmLODIPine Besylate (NORVASC) 5 MG Oral Tab Take 5 mg by mouth daily.       . Atorvastatin Calcium 10 MG Oral Tab Take 10 mg by mouth.       . Calcium Carb-Cholecalciferol (CALCIUM 500 +D) 500-400 MG-UNIT Oral Tab Take 1 tablet by mouth 3 times a day.       . Hydrochlorothiazide 12.5 MG Oral Tab        . Mycophenolate Mofetil 250 MG Oral Cap Take 250 mg by mouth 2 times a day.       Marland Kitchen TACROLIMUS OR Take 1 mg by mouth every 12 hours.       . Ursodiol 500 MG Oral Tab          REVIEW OF SYSTEMS   As above.     PHYSICAL EXAMINATION   VITAL SIGNS: Blood pressure 143/69, pulse 85, temperature 97.9 F (36.6 C), temperature source Temporal, weight 199 lb 11.2 oz (90.583 kg), SpO2 99 %.  GENERAL: Ebony Woodard is a very pleasant, moderately overnourished, well-groomed female in no acute distress. Ebony Woodard appears younger than stated age.   HEENT: No scleral icterus or conjunctival injection. Ebony Woodard has moist mucous membranes.   CARDIOVASCULAR: Regular rate and rhythm with no murmurs, rubs or gallops.   RESPIRATORY: Clear to auscultation bilaterally.   ABDOMEN: Soft, nontender, and nondistended with no organomegaly. Ebony Woodard has a well-healed surgical incision  scar.   NEUROLOGIC: No gross deficits. Ebony Woodard ambulates independently and there is no obvious tremor.   SKIN: No rash or jaundice.   EXTREMITIES: Mild nonpitting lower extremity edema.     LABS   06/18/14  WBC 5.94, Hct 37.4%, PLT 251 K, Sodium 139, potassium 3.9, chloride 101, bicarb 30, BUN 27, creatinine 1.1, glucose 101, calcium 9.5. Total protein 6.8, albumin 4.3, total bilirubin 0.6, direct bilirubin 0.2, AST 29, ALT 33, alkaline phosphatase 117, magnesium 1.8, phos 3.4, GGT 36, vitamin D level is 39, tacrolimus level is 5.1. 24-hour urine volume is 2,350 mL, creatinine over 24 hours is 1081 mg/24 hours, 53 ml/min. The protein was 74 mg over 24 hours.      06/18/14 2 view Chest X-ray: no acute cardiopulmonary abnormality. .       06/18/14 Ultrasound with Doppler of the abdomen: heterogeneous  liver, prominent bowel gas at the porta hepatitis which precluded visualization of the common bile duct. There was no intrahepatic biliary ductal dilatation. The vasculature was normal. .     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   1.   Status post orthotopic liver transplant on immunosuppression. Ebony Woodard is on tacrolimus and MMF dual immunosuppression. Her kidney function although abnormal has been stable over the last year with a creatinine of 1.1 to 1.4. Ebony Woodard has not had complications related to rejection. Given her age and CRI we feel Ebony Woodard could tolerate a lower tacrolimus through level around 3.  We have asked her to reduce her tacrolimus to 1 mg in AM and 0.5 mg PM.Ebony Woodard will need to get more frequent labs monthly x 3 months then quarterly.   2.   Recurrent primary biliary cirrhosis. Ebony Woodard had evidence of recurrent PBC in her allograft around 2010 but was started on Ursodiol at that time and had improvement in her liver tests. Fortunately there was very minimal fibrosis on her last biopsy.    3.   Chronic kidney disease. Again, her creatinine has been stable at 1.1 to 1.4 over the last year. Continue with good blood pressure control. This has been very stable.   4.   Healthcare maintenance. Ebony Woodard is up to date on her healthcare maintenance and will continue to follow with her primary care provider for regular vaccinations (although Ebony Woodard should not receive live vaccines) and cancer screening. Ebony Woodard should continue to have annual skin exams as patients on immunosuppression are at higher risk for skin cancers.    We will plan to see her back in 1 year and Ebony Woodard should get labs monhly x 3 months with recent change in immunosuppression then every 3 months.

## 2014-08-06 ENCOUNTER — Other Ambulatory Visit (HOSPITAL_COMMUNITY): Payer: Self-pay

## 2014-08-06 LAB — BASIC METABOLIC PANEL, EXTERNAL
Calcium, External: 9.6
Carbon Dioxide (Total), External: 30
Chloride, External: 99
Creatinine, External: 1
Glucose, External: 100
Potassium, External: 3.6
Sodium, External: 136
Urea Nitrogen, External: 23

## 2014-08-06 LAB — PROTHROMBIN AND PTT, EXTERNAL: Prothrombin Time Patient, External: 29.5

## 2014-08-06 LAB — CBC, DIFF, EXTERNAL
% Basophils, External: 0.4
% Eosinophils, External: 7.8
% Lymphocytes, External: 18.2
% Monocytes, External: 4.5
% Neutrophils, External: 68.2
Basophils, External: 0.03
Eosinophil Count, External: 0.5
Lymphocyte Count, External: 1.18
Monocytes, External: 0.29
Neutrophils, External: 4.4

## 2014-08-06 LAB — HEPATIC FUNCTION PANEL, EXTERNAL
ALT (GPT), External: 25
AST (GOT), External: 24
Albumin, External: 4.1
Alkaline Phosphatase (Total), External: 118
Bilirubin (Total), External: 0.6
Protein (Total), External: 6.4

## 2014-08-06 LAB — CBC (HEMOGRAM), EXTERNAL
Hematocrit, External: 36.4
Hemoglobin, External: 12.2
MCH, External: 28.9
MCV, External: 86
Platelet Count, External: 255
WBC, External: 6.45

## 2014-08-06 LAB — MAGNESIUM, EXTERNAL: Magnesium, External: 1.7

## 2014-08-06 LAB — TACROLIMUS, EXTERNAL: Tacrolimus, External: 4.1

## 2014-08-06 LAB — COMPREHENSIVE METABOLIC PANEL, EXTERNAL: GFR, Calc, European American, External: 54

## 2014-08-29 ENCOUNTER — Encounter (HOSPITAL_COMMUNITY): Payer: Self-pay

## 2014-08-29 NOTE — Progress Notes (Signed)
Documented per transplant on 08/29/14

## 2014-09-03 ENCOUNTER — Other Ambulatory Visit (HOSPITAL_COMMUNITY): Payer: Self-pay

## 2014-09-03 LAB — HEPATIC FUNCTION PANEL, EXTERNAL
ALT (GPT), External: 36
AST (GOT), External: 31
Albumin, External: 4.1
Alkaline Phosphatase (Total), External: 109
Bilirubin (Direct), External: 0.2
Bilirubin (Total), External: 0.6
Protein (Total), External: 6.9

## 2014-09-03 LAB — CBC (HEMOGRAM), EXTERNAL
Hematocrit, External: 36
Hemoglobin, External: 12
MCH, External: 28.7
MCV, External: 86.2
Platelet Count, External: 251
WBC, External: 5.67

## 2014-09-03 LAB — CBC, DIFF, EXTERNAL
% Basophils, External: 0.5
% Eosinophils, External: 8.7
% Lymphocytes, External: 17
% Monocytes, External: 4.9
% Neutrophils, External: 67.7
Basophils, External: 0.03
Eosinophil Count, External: 0.49
Lymphocyte Count, External: 0.97
Monocytes, External: 0.28
Neutrophils, External: 3.84

## 2014-09-03 LAB — BASIC METABOLIC PANEL, EXTERNAL
Calcium, External: 9.7
Carbon Dioxide (Total), External: 29
Chloride, External: 100
Creatinine, External: 1.1
Glucose, External: 95
Potassium, External: 3.8
Sodium, External: 136
Urea Nitrogen, External: 34

## 2014-09-03 LAB — MAGNESIUM, EXTERNAL: Magnesium, External: 1.9

## 2014-09-03 LAB — GAMMA GLUTAMYL TRANSFERASE, EXTERNAL: Gamma Glutamyl Transferase, External: 37

## 2014-09-03 LAB — COMPREHENSIVE METABOLIC PANEL, EXTERNAL: GFR, Calc, European American, External: 49

## 2014-09-03 LAB — PHOSPHATE, EXTERNAL: Phosphate, External: 3.3

## 2014-09-03 LAB — TACROLIMUS, EXTERNAL: Tacrolimus, External: 4

## 2014-09-16 ENCOUNTER — Encounter (HOSPITAL_COMMUNITY): Payer: Self-pay

## 2014-09-16 NOTE — Progress Notes (Signed)
Updated 09/16/2014 [facility]

## 2014-10-02 ENCOUNTER — Other Ambulatory Visit (HOSPITAL_COMMUNITY): Payer: Self-pay

## 2014-10-02 LAB — CBC (HEMOGRAM), EXTERNAL
Hematocrit, External: 36.8
Hemoglobin, External: 12.2
MCH, External: 28
MCV, External: 84.5
Platelet Count, External: 258
WBC, External: 6.41

## 2014-10-02 LAB — CBC, DIFF, EXTERNAL
% Basophils, External: 0.6
% Eosinophils, External: 9.6
% Lymphocytes, External: 18.6
% Monocytes, External: 4.5
% Neutrophils, External: 65.8
Basophils, External: 0.04
Eosinophil Count, External: 0.62
Lymphocyte Count, External: 1.19
Monocytes, External: 0.29
Neutrophils, External: 4.22

## 2014-10-02 LAB — PROTHROMBIN AND PTT, EXTERNAL
Prothrombin INR, External: 1
Prothrombin Time Patient, External: 13

## 2014-10-02 LAB — GAMMA GLUTAMYL TRANSFERASE, EXTERNAL: Gamma Glutamyl Transferase, External: 44

## 2014-10-02 LAB — BASIC METABOLIC PANEL, EXTERNAL
Calcium, External: 9.6
Carbon Dioxide (Total), External: 29
Chloride, External: 100
Creatinine, External: 1
Glucose, External: 97
Potassium, External: 3.8
Sodium, External: 137
Urea Nitrogen, External: 28

## 2014-10-02 LAB — HEPATIC FUNCTION PANEL, EXTERNAL
ALT (GPT), External: 30
AST (GOT), External: 27
Albumin, External: 4.3
Alkaline Phosphatase (Total), External: 137
Bilirubin (Direct), External: 0.1
Bilirubin (Total), External: 0.6
Protein (Total), External: 6.4

## 2014-10-02 LAB — PHOSPHATE, EXTERNAL: Phosphate, External: 3.4

## 2014-10-02 LAB — COMPREHENSIVE METABOLIC PANEL, EXTERNAL: GFR, Calc, European American, External: 54

## 2014-10-02 LAB — TACROLIMUS, EXTERNAL: Tacrolimus, External: 3.5

## 2014-10-02 LAB — MAGNESIUM, EXTERNAL: Magnesium, External: 1.8

## 2015-02-05 ENCOUNTER — Other Ambulatory Visit (HOSPITAL_COMMUNITY): Payer: Self-pay

## 2015-02-05 LAB — BASIC METABOLIC PANEL, EXTERNAL
Calcium, External: 9.7
Carbon Dioxide (Total), External: 28
Chloride, External: 100
Creatinine, External: 1.1
Glucose, External: 94
Potassium, External: 3.5
Sodium, External: 138
Urea Nitrogen, External: 34

## 2015-02-05 LAB — TACROLIMUS, EXTERNAL: Tacrolimus, External: 4.1

## 2015-02-05 LAB — HEPATIC FUNCTION PANEL, EXTERNAL
ALT (GPT), External: 39
AST (GOT), External: 29
Albumin, External: 4.2
Alkaline Phosphatase (Total), External: 134
Bilirubin (Direct), External: 0.2
Bilirubin (Total), External: 0.8
Protein (Total), External: 7.1

## 2015-02-05 LAB — CBC, DIFF, EXTERNAL
% Basophils, External: 5.3
% Eosinophils, External: 5.5
% Lymphocytes, External: 18.1
% Monocytes, External: 1.2
% Neutrophils, External: 69.5
Basophils, External: 0.02
Eosinophil Count, External: 0.32
Lymphocyte Count, External: 1.1
Monocytes, External: 0.33
Neutrophils, External: 4.22

## 2015-02-05 LAB — CBC (HEMOGRAM), EXTERNAL
Hematocrit, External: 36.3
Hemoglobin, External: 11.9
MCH, External: 28.6
MCV, External: 87.5
Platelet Count, External: 286
WBC, External: 6.07

## 2015-02-05 LAB — PHOSPHATE, EXTERNAL: Phosphate, External: 3.4

## 2015-02-05 LAB — COMPREHENSIVE METABOLIC PANEL, EXTERNAL: GFR, Calc, European American, External: 49

## 2015-02-05 LAB — PROTHROMBIN AND PTT, EXTERNAL
Prothrombin INR, External: 1
Prothrombin Time Patient, External: 12.7

## 2015-02-05 LAB — GAMMA GLUTAMYL TRANSFERASE, EXTERNAL: Gamma Glutamyl Transferase, External: 41

## 2015-02-05 LAB — MAGNESIUM, EXTERNAL: Magnesium, External: 1.7

## 2015-02-10 ENCOUNTER — Encounter (HOSPITAL_COMMUNITY): Payer: Self-pay

## 2015-02-10 NOTE — Progress Notes (Signed)
conf tac dose

## 2015-04-15 ENCOUNTER — Other Ambulatory Visit (HOSPITAL_COMMUNITY): Payer: Self-pay

## 2015-04-15 LAB — CBC, DIFF, EXTERNAL
% Basophils, External: 0.3
% Eosinophils, External: 7.1
% Lymphocytes, External: 14.8
% Monocytes, External: 5.6
% Neutrophils, External: 71
Basophils, External: 0.02
Eosinophil Count, External: 0.41
Lymphocyte Count, External: 0.87
Monocytes, External: 0.33
Neutrophils, External: 4.15

## 2015-04-15 LAB — CBC (HEMOGRAM), EXTERNAL
Hematocrit, External: 37.8
Hemoglobin, External: 12.4
MCH, External: 29
MCV, External: 88.5
Platelet Count, External: 282
WBC, External: 5.85

## 2015-04-15 LAB — HEPATIC FUNCTION PANEL, EXTERNAL
ALT (GPT), External: 32
AST (GOT), External: 28
Albumin, External: 4.2
Alkaline Phosphatase (Total), External: 122
Bilirubin (Direct), External: 0.2
Bilirubin (Total), External: 0.7
Protein (Total), External: 7.3

## 2015-04-15 LAB — BASIC METABOLIC PANEL, EXTERNAL
Calcium, External: 9.4
Carbon Dioxide (Total), External: 30
Chloride, External: 99
Creatinine, External: 1
Glucose, External: 98
Potassium, External: 3.5
Sodium, External: 138
Urea Nitrogen, External: 26

## 2015-04-15 LAB — PROTHROMBIN AND PTT, EXTERNAL
Prothrombin INR, External: 1
Prothrombin Time Patient, External: 12.4

## 2015-04-15 LAB — TACROLIMUS, EXTERNAL: Tacrolimus, External: 3.7

## 2015-04-15 LAB — COMPREHENSIVE METABOLIC PANEL, EXTERNAL: GFR, Calc, European American, External: 54

## 2015-04-15 LAB — GAMMA GLUTAMYL TRANSFERASE, EXTERNAL: Gamma Glutamyl Transferase, External: 42

## 2015-04-15 LAB — PHOSPHATE, EXTERNAL: Phosphate, External: 3.2

## 2015-04-15 LAB — MAGNESIUM, EXTERNAL: Magnesium, External: 1.8

## 2015-07-16 ENCOUNTER — Other Ambulatory Visit (HOSPITAL_COMMUNITY): Payer: Self-pay

## 2015-07-16 LAB — BASIC METABOLIC PANEL, EXTERNAL
Calcium, External: 9.4
Carbon Dioxide (Total), External: 29
Chloride, External: 100
Creatinine, External: 1
Glucose, External: 98
Potassium, External: 3.7
Sodium, External: 139
Urea Nitrogen, External: 36

## 2015-07-16 LAB — CBC, DIFF, EXTERNAL
% Basophils, External: 0.2
% Eosinophils, External: 7.3
% Lymphocytes, External: 16.1
% Monocytes, External: 5.1
% Neutrophils, External: 70.1
Basophils, External: 0.01
Eosinophil Count, External: 0.44
Lymphocyte Count, External: 0.99
Monocytes, External: 0.31
Neutrophils, External: 4.23

## 2015-07-16 LAB — TACROLIMUS, EXTERNAL: Tacrolimus, External: 3.4

## 2015-07-16 LAB — CBC (HEMOGRAM), EXTERNAL
Hematocrit, External: 36.1
Hemoglobin, External: 11.8
MCH, External: 28.7
MCV, External: 87.8
Platelet Count, External: 281
WBC, External: 6.03

## 2015-07-16 LAB — HEPATIC FUNCTION PANEL, EXTERNAL
ALT (GPT), External: 33
AST (GOT), External: 29
Albumin, External: 4.1
Alkaline Phosphatase (Total), External: 160
Bilirubin (Direct), External: 0.1
Bilirubin (Total), External: 0.6
Protein (Total), External: 6.9

## 2015-07-16 LAB — PROTEIN/CREATININE RATIO, URN, EXTERNAL
Creatinine/24hr, URN, External: 1131
Creatinine/Unit, URN, External: 39
Protein (Total)/24 hr, URN, External: 116
Protein/Creatinine Ratio, External: 0.1

## 2015-07-16 LAB — PHOSPHATE, EXTERNAL: Phosphate, External: 3.6

## 2015-07-16 LAB — PROTHROMBIN AND PTT, EXTERNAL
Prothrombin INR, External: 1
Prothrombin Time Patient, External: 12.9

## 2015-07-16 LAB — LIPID PANEL, EXTERNAL
Cholesterol (HDL), External: 63
Cholesterol (LDL), External: 75
Cholesterol (Total), External: 154
Triglyceride, External: 79

## 2015-07-16 LAB — CREATININE CLEARANCE, URN, EXTERNAL
Creatinine Clearance, External: 68
Total Volume, External: 2900

## 2015-07-16 LAB — VITAMIN D (25 HYDROXY), EXTERNAL: Vit D (25_Hydroxy) Total, External: 50

## 2015-07-16 LAB — COMPREHENSIVE METABOLIC PANEL, EXTERNAL: GFR, Calc, European American, External: 54

## 2015-07-16 LAB — GAMMA GLUTAMYL TRANSFERASE, EXTERNAL: Gamma Glutamyl Transferase, External: 47

## 2015-07-16 LAB — MAGNESIUM, EXTERNAL: Magnesium, External: 1.9

## 2015-07-25 ENCOUNTER — Other Ambulatory Visit (HOSPITAL_BASED_OUTPATIENT_CLINIC_OR_DEPARTMENT_OTHER): Payer: Self-pay

## 2015-07-25 DIAGNOSIS — K743 Primary biliary cirrhosis: Secondary | ICD-10-CM

## 2015-07-25 DIAGNOSIS — Z944 Liver transplant status: Secondary | ICD-10-CM

## 2015-07-25 MED ORDER — TACROLIMUS 0.5 MG OR CAPS
0.5000 mg | ORAL_CAPSULE | Freq: Every evening | ORAL | Status: AC
Start: 2015-07-25 — End: ?

## 2015-07-25 MED ORDER — TACROLIMUS 1 MG OR CAPS
1.0000 mg | ORAL_CAPSULE | Freq: Every morning | ORAL | Status: AC
Start: 2015-07-25 — End: ?

## 2015-08-08 ENCOUNTER — Encounter (HOSPITAL_BASED_OUTPATIENT_CLINIC_OR_DEPARTMENT_OTHER): Payer: Self-pay

## 2015-08-08 ENCOUNTER — Ambulatory Visit: Payer: Medicare Other | Attending: Gastroenterology | Admitting: Gastroenterology

## 2015-08-08 VITALS — BP 132/79 | HR 75 | Temp 97.0°F | Ht 66.14 in | Wt 189.6 lb

## 2015-08-08 DIAGNOSIS — K743 Primary biliary cirrhosis: Secondary | ICD-10-CM | POA: Insufficient documentation

## 2015-08-08 DIAGNOSIS — Z944 Liver transplant status: Secondary | ICD-10-CM | POA: Insufficient documentation

## 2015-08-08 NOTE — Patient Instructions (Addendum)
1) Your liver function looks great!    2) We will make no changes today.    3) We will see you back in 1 year    4) We recommend you get a DEXA scan with your PCP given your recurrent PBC    5) We will perform a fibroscan at your next visit

## 2015-08-08 NOTE — Progress Notes (Addendum)
POST LIVER TRANSPLANT CLINIC NOTE    IDENTIFICATION AND CHIEF COMPLAINT  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    OTHER PROVIDERS  PCP: Ebony Houseman, MD, family medicine, [facility]  GI: Ebony Mesi, MD    INTERVAL HISTORY  [name] was last seen in Post Liver Transplant Clinic one year ago in 07/2014. 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 She has had no vascular/thrombotic, or biliary complications or prior episodes of rejection. She is currently taking tacrolimus 1mg  qam and 0.5mg  qpm and MMF 250mg  BID. She has been on the same does of MMF since 2010.    In the interim since her last visit, there have been no hospitalizations, or major infections. She did visit the ER after a mechanical fall about 4 months ago during which she sustained a shoulder strain which improved with home exercises. Reports she is doing well, has another grandchild - 41 month old who her son is currently fostering. Has intentionally lost 10 lbs over the last year due to healthier eating. Continues to stay active.    From a symptom standpoint denies fevers, night sweats, abdominal pain, n/v/d, jaundice, hematochezia, melena, SOB, chest pain.    Vaccination status:  Influenza: Received in the last influenza season  Pneumococcal:  Is due for this, plans to schedule with PCP    Preventative care:  Sun protection/annual skin examination:  Gets routinely, once year.  Colorectal cancer screening:  Due 12/2018  Breast cancer screening/Pap smear (if applicable): Up to date on mammogram; doesn't think she requires     Dietary habits, physical activity and weight change:   As above has intentionally lost 10 lbs due to healthier eating.    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 AmLODIPine Besylate (NORVASC) 5 MG Oral Tab Take 5 mg by mouth daily.     . Atorvastatin Calcium 10 MG Oral Tab Take 10 mg by mouth.     . Calcium Carb-Cholecalciferol (CALCIUM 500 +D) 500-400 MG-UNIT Oral Tab Take 1 tablet by mouth 3 times a day.     . Hydrochlorothiazide 12.5 MG Oral Tab      . Mycophenolate Mofetil 250 MG Oral Cap Take 250 mg by mouth 2 times a day.     . Tacrolimus 0.5 MG Oral Cap Take 1 capsule (0.5 mg) by mouth every evening. Or as directed by provider. 90 capsule 3   . Tacrolimus 1 MG Oral Cap Take 1 capsule (1 mg) by mouth every morning. 90 capsule 3   . Ursodiol 500 MG Oral Tab        No current facility-administered medications for this visit.       SOCIAL HISTORY  Social History   Substance Use Topics   . Smoking  status: Never Smoker    . Smokeless tobacco: Not on file   . Alcohol Use: No       REVIEW OF SYSTEMS  Complete review of systems is negative except as noted in Interval History.    PHYSICAL EXAMINATION   BP 132/79 mmHg  Pulse 75  Temp(Src) 97 F (36.1 C) (Temporal)  Ht 5' 6.14" (1.68 m)  Wt 189 lb 9.5 oz (86 kg)  BMI 30.47 kg/m2  SpO2 100%    Wt Readings from Last 5 Encounters:   08/08/15 189 lb 9.5 oz (86 kg)   07/19/14 199 lb 11.2 oz (90.583 kg)   06/01/13 205 lb 11 oz (93.299 kg)       Constitutional/ General Apperance: Well appearing woman, NAD  Eyes: Conjunctiva anicteric  Oropharynx: Clear no lesions  Neck: Supple non-tender  Lungs: CTAB, breathing non-labored  Heart: RRR, no m/r/g, wearing stockings trace edema in her ankles  Abd: BT+ soft, non-tender, well healed scar,  non-distended  Ext: Stockings in place; trace edema in the ankles  Neuro: Alert, answers questions appropriately  Skin: No jaundice no rashes      LABORATORY TESTS/STUDIES              CXR, 07/10/2015:   1. No acute disease or interval change.  2. Probable small calcified benign granuloma or hamartoma left lower  chest as before.        Abdominal US with Doppler, 07/10/2015 [facility]):    1. Heterogeneous appearance of transplant liver similar to prior  study. No focal mass.  2. Unremarkable Doppler evaluation of liver. Hepatic artery, portal  vein and hepatic veins appear patent and unremarkable.  3. Interval increase in splenic size. No varices or ascites.  4. Pancreas partially obscured by bowel gas. 8mm pancreatic cyst,  previously 10 mm. Characteristics not suggestive of malignancy. No  main pancreatic duct dilation.  5. Exam otherwise as above. Followup as indicated clinically.      Abdominal CT/MRI (for those with history of high risk HCC):     Last liver biopsy, 02/2011:  FINAL DIAGNOSIS:    Liver, Needle Core Biopsies:  No evidence of acute cellular rejection or chronic rejection  (ductopenia).  Patchy portal inflammation and patchy ductular reaction.  No significant interface hepatitis and minimal lobular hepatitis.  Mild portal fibrosis.  No copper deposition identified on rhodanine stain.    DIAGNOSIS COMMENT:  Mild portal fibrosis is noted on reticulin and trichrome stains. No  iron deposition is noted on Prussian blue stain. No cytoplasmic  deposits are seen by PAS-diastase stain. Early recurrent primary  biliary cirrhosis cannot be excluded.      ASSESSMENT AND PLAN  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 Post transplant course c/b recurrent PBC for which she was started on ursodiol in 2010 with subsequent normalization of her LFTs. 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     1.  Liver graft function/immunosuppression: No evidence of graft dysfunction or concerns for over-immunosuppression. As such no medication changes today.   - Continue tacrolimus 1mg  Qam and 0.5mg  Qpm -> ok for Dr. Tommie SamsGottschlich to prescribe this per patient preference.   - Continue MMF 250mg  Q12H   - Continue labs Q3 months    2.  Recurrent PBC: LFTs normalized after ursodiol resumed in 2010. Last biopsy in 2012 which mild portal fibrosis.   -  Fibroscan at next visit   - Recommend DEXA scan with PCP    - Continue ursodiol    3.  CKD: Related to CNI toxicity. Stable.    4.  Healthcare maintenance: Up to date      GENERAL RECOMMENDATIONS  1.  Risk of chronic kidney disease:  Avoid nephrotoxic medications including nonsteroidal anti-inflammatory drugs.    1. Risk of systemic hypertension:  Target a blood pressure of less than 135/85 mm Hg.   2. Risk of malignancies:  A.  Complete skin examination at least once yearly.  B.  Regular use of sun block (of at least 15 SPF) and protective clothing.  C.  Remain up to date with all other age-appropriate cancer screening  3. Risk of infections:  A.  Remain up to date with all age-appropriate vaccinations (except avoid live virus vaccines).  B.  Pneumococcal vaccination:    i)  Assuming prior vaccination with Prevnar13 and Pnemovax23, administer Pneumovax23 five years following last dose of Pneumovax23 and again final one at age 68 years of age.   ii) If no prior Prevnar13 vaccination and if more than one year following last Pneumovax23, then administer Prevnar13, followed by Pneumovax23 8 weeks or more later or 5 years after last Pneumovax23, and then final Pneumovax23 at age 53 years.  C.  Other preventative measures include: i) frequent hand washing (to reduce the risk of infection with pathogens acquired by direct contact), and ii) avoiding consumption of unpasteurized milk products, raw or undercooked eggs or meats, water from lakes or rivers.    4. Remain on a  healthy diet, be physically active, and maintain a healthy weight.  5. Avoid tobacco (high risk of lung and head/neck cancers), alcohol (high risk of allograft dysfunction), and recreational drugs, including marijuana.     [name] will return to the liver transplant clinic in 1 year for her annual follow up with laboratory studies, and imaging. The patient was seen and evaluated with Hepatology Attending, Dr. Barbara Cower.

## 2015-08-10 NOTE — Progress Notes (Signed)
I saw and evaluated the patient. I have reviewed Dr. Barahimi's documentation and agree with it.

## 2015-09-23 ENCOUNTER — Other Ambulatory Visit (HOSPITAL_COMMUNITY): Payer: Self-pay

## 2015-09-23 LAB — CBC (HEMOGRAM), EXTERNAL
Hematocrit, External: 36.3
Hemoglobin, External: 11.9
MCH, External: 28.4
MCV, External: 86.2
Platelet Count, External: 265
WBC, External: 5.79

## 2015-09-23 LAB — CBC, DIFF, EXTERNAL
% Basophils, External: 1
% Eosinophils, External: 6.7
% Lymphocytes, External: 18
% Monocytes, External: 4.8
% Neutrophils, External: 68.3
Basophils, External: 0.06
Eosinophil Count, External: 0.39
Lymphocyte Count, External: 1.04
Monocytes, External: 0.28
Neutrophils, External: 3.96

## 2015-09-23 LAB — PHOSPHATE, EXTERNAL: Phosphate, External: 3.1

## 2015-09-23 LAB — GAMMA GLUTAMYL TRANSFERASE, EXTERNAL: Gamma Glutamyl Transferase, External: 63

## 2015-09-23 LAB — TACROLIMUS, EXTERNAL: Tacrolimus, External: 3.4

## 2015-09-23 LAB — BASIC METABOLIC PANEL, EXTERNAL
Calcium, External: 9.3
Carbon Dioxide (Total), External: 29
Chloride, External: 101
Creatinine, External: 0.9
Glucose, External: 94
Potassium, External: 3.9
Sodium, External: 139
Urea Nitrogen, External: 26

## 2015-09-23 LAB — HEPATIC FUNCTION PANEL, EXTERNAL
ALT (GPT), External: 30
AST (GOT), External: 26
Albumin, External: 4
Alkaline Phosphatase (Total), External: 164
Bilirubin (Direct), External: 0.2
Bilirubin (Total), External: 0.6
Protein (Total), External: 7.1

## 2015-09-23 LAB — PROTHROMBIN AND PTT, EXTERNAL
Prothrombin INR, External: 1
Prothrombin Time Patient, External: 12.8

## 2015-09-23 LAB — MAGNESIUM, EXTERNAL: Magnesium, External: 1.8

## 2015-09-23 LAB — COMPREHENSIVE METABOLIC PANEL, EXTERNAL: GFR, Calc, European American, External: >60

## 2015-12-05 ENCOUNTER — Other Ambulatory Visit (HOSPITAL_COMMUNITY): Payer: Self-pay

## 2015-12-05 LAB — BASIC METABOLIC PANEL, EXTERNAL
Calcium, External: 9.5
Carbon Dioxide (Total), External: 30
Chloride, External: 101
Creatinine, External: 1.2
Glucose, External: 93
Potassium, External: 4
Sodium, External: 139
Urea Nitrogen, External: 38

## 2015-12-05 LAB — HEPATIC FUNCTION PANEL, EXTERNAL
ALT (GPT), External: 27
AST (GOT), External: 25
Albumin, External: 4
Alkaline Phosphatase (Total), External: 167
Bilirubin (Direct), External: 0.2
Bilirubin (Total), External: 0.6
Protein (Total), External: 6.9

## 2015-12-05 LAB — CBC, DIFF, EXTERNAL
% Basophils, External: 0.8
% Eosinophils, External: 5.9
% Lymphocytes, External: 13.4
% Monocytes, External: 4.1
% Neutrophils, External: 74.5
Basophils, External: 0.05
Eosinophil Count, External: 0.36
Lymphocyte Count, External: 0.81
Monocytes, External: 0.25
Neutrophils, External: 4.52

## 2015-12-05 LAB — MAGNESIUM, EXTERNAL: Magnesium, External: 1.8

## 2015-12-05 LAB — CBC (HEMOGRAM), EXTERNAL
Hematocrit, External: 33.7
Hemoglobin, External: 11.5
MCH, External: 29.8
MCV, External: 87.2
Platelet Count, External: 276
WBC, External: 6.07

## 2015-12-05 LAB — PROTHROMBIN AND PTT, EXTERNAL
Prothrombin INR, External: 0.9
Prothrombin Time Patient, External: 12.3

## 2015-12-05 LAB — TACROLIMUS, EXTERNAL: Tacrolimus, External: 3.2

## 2015-12-05 LAB — COMPREHENSIVE METABOLIC PANEL, EXTERNAL: GFR, Calc, European American, External: 44

## 2015-12-05 LAB — PHOSPHATE, EXTERNAL: Phosphate, External: 3.5

## 2015-12-05 LAB — GAMMA GLUTAMYL TRANSFERASE, EXTERNAL: Gamma Glutamyl Transferase, External: 60

## 2016-02-26 ENCOUNTER — Other Ambulatory Visit (HOSPITAL_COMMUNITY): Payer: Self-pay

## 2016-02-26 LAB — BASIC METABOLIC PANEL, EXTERNAL
Calcium, External: 9.5
Carbon Dioxide (Total), External: 27
Chloride, External: 100
Creatinine, External: 1.2
Glucose, External: 103
Potassium, External: 3.8
Sodium, External: 137
Urea Nitrogen, External: 34

## 2016-02-26 LAB — CBC, DIFF, EXTERNAL
% Basophils, External: 0.3
% Eosinophils, External: 5.6
% Lymphocytes, External: 19.4
% Monocytes, External: 5.8
% Neutrophils, External: 67.6
Basophils, External: 0.02
Eosinophil Count, External: 0.33
Lymphocyte Count, External: 1.14
Monocytes, External: 0.34
Neutrophils, External: 3.98

## 2016-02-26 LAB — HEPATIC FUNCTION PANEL, EXTERNAL
ALT (GPT), External: 36
AST (GOT), External: 31
Albumin, External: 4
Alkaline Phosphatase (Total), External: 187
Bilirubin (Direct), External: 0.2
Bilirubin (Total), External: 0.6
Protein (Total), External: 7.3

## 2016-02-26 LAB — CBC (HEMOGRAM), EXTERNAL
Hematocrit, External: 37.1
Hemoglobin, External: 12.1
MCH, External: 28.6
MCV, External: 87.6
Platelet Count, External: 279
WBC, External: 5.89

## 2016-02-26 LAB — PHOSPHATE, EXTERNAL: Phosphate, External: 3.3

## 2016-02-26 LAB — TACROLIMUS, EXTERNAL: Tacrolimus, External: 4

## 2016-02-26 LAB — PROTHROMBIN AND PTT, EXTERNAL
Prothrombin INR, External: 1
Prothrombin Time Patient, External: 12.9

## 2016-02-26 LAB — GAMMA GLUTAMYL TRANSFERASE, EXTERNAL: Gamma Glutamyl Transferase, External: 61

## 2016-02-26 LAB — COMPREHENSIVE METABOLIC PANEL, EXTERNAL: GFR, Calc, European American, External: 44

## 2016-02-26 LAB — MAGNESIUM, EXTERNAL: Magnesium, External: 1.7

## 2016-05-26 ENCOUNTER — Other Ambulatory Visit (HOSPITAL_COMMUNITY): Payer: Self-pay

## 2016-05-26 LAB — CBC, DIFF, EXTERNAL
% Basophils, External: 0.6
% Eosinophils, External: 5
% Lymphocytes, External: 16.7
% Monocytes, External: 5.3
% Neutrophils, External: 71.6
Basophils, External: 0.04
Eosinophil Count, External: 0.33
Lymphocyte Count, External: 1.08
Monocytes, External: 0.34
Neutrophils, External: 4.65

## 2016-05-26 LAB — GAMMA GLUTAMYL TRANSFERASE, EXTERNAL: Gamma Glutamyl Transferase, External: 64

## 2016-05-26 LAB — CBC (HEMOGRAM), EXTERNAL
Hematocrit, External: 35.7
Hemoglobin, External: 11.6
MCH, External: 28.7
MCV, External: 88.4
Platelet Count, External: 283
WBC, External: 6.5

## 2016-05-26 LAB — BASIC METABOLIC PANEL, EXTERNAL
Calcium, External: 9.5
Carbon Dioxide (Total), External: 30
Chloride, External: 100
Creatinine, External: 1
Glucose, External: 96
Potassium, External: 3.9
Sodium, External: 139
Urea Nitrogen, External: 30

## 2016-05-26 LAB — HEPATIC FUNCTION PANEL, EXTERNAL
ALT (GPT), External: 34
AST (GOT), External: 28
Albumin, External: 4.1
Alkaline Phosphatase (Total), External: 205
Bilirubin (Direct), External: 0.2
Bilirubin (Total), External: 0.6
Protein (Total), External: 6.9

## 2016-05-26 LAB — TACROLIMUS, EXTERNAL: Tacrolimus, External: 3.6

## 2016-05-26 LAB — MAGNESIUM, EXTERNAL: Magnesium, External: 1.8

## 2016-05-26 LAB — PROTHROMBIN AND PTT, EXTERNAL
Prothrombin INR, External: 1
Prothrombin Time Patient, External: 12.6

## 2016-05-26 LAB — PHOSPHATE, EXTERNAL: Phosphate, External: 3.5

## 2016-05-26 LAB — COMPREHENSIVE METABOLIC PANEL, EXTERNAL: GFR, Calc, European American, External: 54

## 2016-06-16 ENCOUNTER — Other Ambulatory Visit (HOSPITAL_COMMUNITY): Payer: Self-pay

## 2016-06-16 LAB — HEPATIC FUNCTION PANEL, EXTERNAL
ALT (GPT), External: 35
AST (GOT), External: 31
Albumin, External: 4
Alkaline Phosphatase (Total), External: 197
Bilirubin (Direct), External: 0.2
Bilirubin (Total), External: 0.7
Protein (Total), External: 7

## 2016-06-16 LAB — BASIC METABOLIC PANEL, EXTERNAL
Calcium, External: 9.3
Carbon Dioxide (Total), External: 30
Chloride, External: 100
Creatinine, External: 1
Glucose, External: 96
Potassium, External: 3.3
Sodium, External: 138
Urea Nitrogen, External: 28

## 2016-06-16 LAB — PHOSPHATE, EXTERNAL: Phosphate, External: 2.9

## 2016-06-16 LAB — CBC (HEMOGRAM), EXTERNAL
Hematocrit, External: 35.4
Hemoglobin, External: 11.4
MCH, External: 29.2
MCV, External: 90.7
Platelet Count, External: 266
WBC, External: 5.49

## 2016-06-16 LAB — CBC, DIFF, EXTERNAL
% Basophils, External: 0.9
% Eosinophils, External: 5.4
% Lymphocytes, External: 16
% Monocytes, External: 4.5
% Neutrophils, External: 71.8
Basophils, External: 0.05
Eosinophil Count, External: 0.29
Lymphocyte Count, External: 0.88
Monocytes, External: 0.25
Neutrophils, External: 3.94

## 2016-06-16 LAB — MAGNESIUM, EXTERNAL: Magnesium, External: 1.8

## 2016-06-16 LAB — COMPREHENSIVE METABOLIC PANEL, EXTERNAL: GFR, Calc, European American, External: 54

## 2016-06-16 LAB — PROTHROMBIN AND PTT, EXTERNAL
Prothrombin INR, External: 1
Prothrombin Time Patient, External: 13

## 2016-06-16 LAB — GAMMA GLUTAMYL TRANSFERASE, EXTERNAL: Gamma Glutamyl Transferase, External: 65

## 2016-06-16 LAB — TACROLIMUS, EXTERNAL: Tacrolimus, External: 4.2

## 2016-08-25 ENCOUNTER — Other Ambulatory Visit (HOSPITAL_COMMUNITY): Payer: Self-pay

## 2016-08-25 LAB — BASIC METABOLIC PANEL, EXTERNAL
Calcium, External: 9.3
Carbon Dioxide (Total), External: 30
Chloride, External: 101
Creatinine, External: 1
Glucose, External: 99
Potassium, External: 3.7
Sodium, External: 140
Urea Nitrogen, External: 28

## 2016-08-25 LAB — PROTHROMBIN AND PTT, EXTERNAL
Prothrombin INR, External: 1
Prothrombin Time Patient, External: 12.7

## 2016-08-25 LAB — HEPATIC FUNCTION PANEL, EXTERNAL
ALT (GPT), External: 28
AST (GOT), External: 26
Albumin, External: 4.1
Alkaline Phosphatase (Total), External: 199
Bilirubin (Direct), External: 0.2
Bilirubin (Total), External: 0.6
Protein (Total), External: 6.9

## 2016-08-25 LAB — CBC (HEMOGRAM), EXTERNAL
Hematocrit, External: 34.4
Hemoglobin, External: 11.3
MCH, External: 28.7
MCV, External: 87.2
Platelet Count, External: 270
WBC, External: 5.12

## 2016-08-25 LAB — CBC, DIFF, EXTERNAL
% Basophils, External: 0.6
% Eosinophils, External: 6.6
% Lymphocytes, External: 17.6
% Monocytes, External: 4.2
% Neutrophils, External: 70.4
Basophils, External: 0.03
Eosinophil Count, External: 0.34
Lymphocyte Count, External: 0.9
Monocytes, External: 0.21
Neutrophils, External: 3.6

## 2016-08-25 LAB — PHOSPHATE, EXTERNAL: Phosphate, External: 3.3

## 2016-08-25 LAB — COMPREHENSIVE METABOLIC PANEL, EXTERNAL: GFR, Calc, European American, External: 54

## 2016-08-25 LAB — MAGNESIUM, EXTERNAL: Magnesium, External: 1.9

## 2016-08-25 LAB — TACROLIMUS, EXTERNAL: Tacrolimus, External: 2.7

## 2016-08-25 LAB — GAMMA GLUTAMYL TRANSFERASE, EXTERNAL: Gamma Glutamyl Transferase, External: 62

## 2016-10-07 ENCOUNTER — Other Ambulatory Visit (HOSPITAL_COMMUNITY): Payer: Self-pay

## 2016-10-07 LAB — CBC, DIFF, EXTERNAL
% Basophils, External: 0.4
% Eosinophils, External: 5
% Lymphocytes, External: 13.3
% Monocytes, External: 3.8
% Neutrophils, External: 76.8
Basophils, External: 0.02
Eosinophil Count, External: 0.31
Lymphocyte Count, External: 0.81
Monocytes, External: 0.23
Neutrophils, External: 4.67

## 2016-10-07 LAB — HEPATIC FUNCTION PANEL, EXTERNAL
ALT (GPT), External: 29
AST (GOT), External: 28
Albumin, External: 4.1
Alkaline Phosphatase (Total), External: 218
Bilirubin (Direct), External: 0.3
Bilirubin (Total), External: 0.7
Protein (Total), External: 7.1

## 2016-10-07 LAB — CBC (HEMOGRAM), EXTERNAL
Hematocrit, External: 34.6
Hemoglobin, External: 11.5
MCH, External: 29.2
MCV, External: 88.1
Platelet Count, External: 292
WBC, External: 6.08

## 2016-10-07 LAB — BASIC METABOLIC PANEL, EXTERNAL
Calcium, External: 9.5
Carbon Dioxide (Total), External: 29
Chloride, External: 100
Creatinine, External: 1
Glucose, External: 100
Potassium, External: 3.6
Sodium, External: 139
Urea Nitrogen, External: 30

## 2016-10-07 LAB — LIPID PANEL, EXTERNAL
Cholesterol (HDL), External: 62
Cholesterol (LDL), External: 65
Cholesterol (Total), External: 148
Triglyceride, External: 106

## 2016-10-07 LAB — GAMMA GLUTAMYL TRANSFERASE, EXTERNAL: Gamma Glutamyl Transferase, External: 82

## 2016-10-07 LAB — PHOSPHATE, EXTERNAL: Phosphate, External: 3.4

## 2016-10-07 LAB — TACROLIMUS, EXTERNAL: Tacrolimus, External: 3.7

## 2016-10-07 LAB — COMPREHENSIVE METABOLIC PANEL, EXTERNAL: GFR, Calc, European American, External: 54

## 2016-10-07 LAB — PROTHROMBIN AND PTT, EXTERNAL
Prothrombin INR, External: 1.1
Prothrombin Time Patient, External: 13.5

## 2016-10-07 LAB — MAGNESIUM, EXTERNAL: Magnesium, External: 1.8

## 2016-10-07 LAB — PROTEIN/CREATININE RATIO, URN, EXTERNAL: Creatinine/24hr, URN, External: 812

## 2016-10-07 LAB — CREATININE CLEARANCE, URN, EXTERNAL: Total Volume, External: 2800

## 2016-10-22 ENCOUNTER — Ambulatory Visit: Payer: Medicare Other | Attending: Gastroenterology | Admitting: Gastroenterology

## 2016-10-22 VITALS — BP 143/68 | HR 78 | Temp 97.5°F | Ht 66.14 in | Wt 194.2 lb

## 2016-10-22 DIAGNOSIS — Z6831 Body mass index (BMI) 31.0-31.9, adult: Secondary | ICD-10-CM

## 2016-10-22 DIAGNOSIS — N183 Chronic kidney disease, stage 3 unspecified: Secondary | ICD-10-CM

## 2016-10-22 DIAGNOSIS — D849 Immunodeficiency, unspecified: Secondary | ICD-10-CM

## 2016-10-22 DIAGNOSIS — Z944 Liver transplant status: Secondary | ICD-10-CM

## 2016-10-22 DIAGNOSIS — K743 Primary biliary cirrhosis: Secondary | ICD-10-CM | POA: Insufficient documentation

## 2016-10-22 DIAGNOSIS — D899 Disorder involving the immune mechanism, unspecified: Secondary | ICD-10-CM

## 2016-10-22 NOTE — Progress Notes (Signed)
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  -no vascular/thrombotic, or biliary complications or prior episodes of rejection     INTERVAL HISTORY  Ebony Woodard was last seen in Post Liver Transplant Clinic in 07/2015. In the interim, she has overall been doing very well.  She denies hospitalizations or infections.  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   She denies headaches, tremors.     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 AmLODIPine Besylate (NORVASC) 5 MG Oral Tab Take 5 mg by mouth daily.     . Atorvastatin Calcium 10 MG Oral Tab Take 10 mg by mouth.     . Calcium Carb-Cholecalciferol (CALCIUM  500 +D) 500-400 MG-UNIT Oral Tab Take 1 tablet by mouth 3 times a day.     . Hydrochlorothiazide 12.5 MG Oral Tab      . Multiple Vitamin (MULTI-VITAMIN DAILY) Oral Tab Take 1 tablet by mouth.     . Mycophenolate Mofetil 250 MG Oral Cap Take 250 mg by mouth 2 times a day.     . Tacrolimus 0.5 MG Oral Cap Take 1 capsule (0.5 mg) by mouth every evening. Or as directed by provider. 90 capsule 3   . Tacrolimus 1 MG Oral Cap Take 1 capsule (1 mg) by mouth every morning. 90 capsule 3   . Ursodiol 500 MG Oral Tab Take 500 mg by mouth 2 times a day.       No current facility-administered medications for this visit.      FAMILY HISTORY  Family history of liver disease:  NO    SOCIAL HISTORY  Social History   Substance Use Topics   . Smoking status: Never Smoker   . Smokeless tobacco: Not on file   . Alcohol use No     REVIEW OF SYSTEMS  Complete review of systems is negative except as noted in Interval History.    PHYSICAL EXAMINATION   BP 143/68 Comment: post 5 mins  Pulse 78   Temp 97.5 F (36.4  C) (Temporal)   Ht 5' 6.14" (1.68 m)   Wt 194 lb 3.6 oz (88.1 kg)   SpO2 99% Comment: ra  BMI 31.22 kg/m     Wt Readings from Last 5 Encounters:   10/22/16 194 lb 3.6 oz (88.1 kg)   08/08/15 189 lb 9.5 oz (86 kg)   07/19/14 199 lb 11.2 oz (90.6 kg)   06/01/13 205 lb 11 oz (93.3 kg)     Constitutional/ General Apperance: Alert, no distress  Eyes: EOMI, no scleral icterus.  Oropharynx: Lips, mucosa, and tongue normal.  Neck: Neck supple. No adenopathy. No goiter.  Lungs: Clear to auscultation.  Heart: Normal rate, regular rhythm. No murmur,click, or gallop.  Abd: Abdomen soft, not distended.  Ext: Normal, without deformities, edema, or skin discoloration.  Neuro: Moving all extremities, gait normal, no asterixis.  Skin: Skin color, texture, turgor normal. No rashes or concerning lesions. No jaundice.    LABORATORY TESTS/STUDIES  No visits with results within 1 Month(s) from this visit.   Latest known visit with results is:    No results found for any previous visit.         24h urine CrCl and total protein:   2800cc, Cr 812    CXR:   10[place]/16 [place]  FINDINGS:  Lungs/Pleura: No focal infiltrate or evidence of edema. No pleural  effusion.  There is a small calcified granuloma at the left lower lung laterally.    Mediastinum: Heart and mediastinal contours are unremarkable.    Other: No significant change from prior.    Abdominal US with Doppler:    US 10/14/16 [place]  1. Heterogeneous transplanted liver unchanged.  2. Unremarkable Doppler evaluation of the liver. Patent portal vein,  hepatic artery and hepatic veins.  3. Splenomegaly although decreased in size.  4. Pancreatic parenchymal cyst without significant change.  5. Renal cysts.    Last liver biopsy:   FINAL DIAGNOSIS:    Liver, Needle Core Biopsies:  No evidence of acute cellular rejection or chronic rejection  (ductopenia).  Patchy portal inflammation and patchy ductular reaction.  No significant interface hepatitis and minimal lobular hepatitis.  Mild portal fibrosis.  No copper deposition identified on rhodanine stain.    DIAGNOSIS COMMENT:  Mild portal fibrosis is noted on reticulin and trichrome stains. No  iron deposition is noted on Prussian blue stain. No cytoplasmic  deposits are seen by PAS-diastase stain. Early recurrent primary  biliary cirrhosis cannot be excluded.    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    1.  Liver graft function/immunosuppression: Her graft function remains excellent.  Her alkaline phosphatase is elevated, although she remains on ursodiol and overall she is doing very well.   A.  Continue tacrolimus at the unchanged dose of 1 mg in the morning and 0.5 mg in the evening with a goal trough level of 3.  B.  Other immunosuppression: mycophenolate 250 mg twice a day  C.  Frequency of lab  testing: every 3 months  D.  Next lab draw due:  In 3 months    2.  (Risk of) Recurrent disease: She has recurrent PBC since 2010 with prior normalization of liver enzymes with ursodiol. She remains on ursodiol 500 mg BID (~11.5 mg/kg) with recent slight increase in her alkaline phosphatase, which we would recommend monitoring for now.  3.  Transplant-related complications: no vascular, biliary issues or rejection    4.   Imaging:         A. For all patients:  Repeat CXR, liver ultrasound with Doppler, and possible             Fibroscan in one year.         B.  APPLICABLE? NO:  For patients at high risk for recurrence of   hepatocellular carcinoma based on pretransplant or explant characteristics,   continue dynamic contrast enhanced cross-sectional imaging (e.g. Multiphase   abdominal CT or MRIscan) for [facility] surveillance until year 5 following   transplantation due to risk of both intra and extrahepatic recurrence.    5.  Healthcare maintenance: she is up to date on screening and vaccinations.    6.  CKD: she has CKD with reported baseline Cr of 1.4, but creatinine has been stable over the last few years at 1.0.     7. Osteoporosis: she is on calcium and vitamin D. We recommended continuing these and ensuring that her vitamin D level is optimal (was 50 in 2017).     GENERAL RECOMMENDATIONS  1.  Risk of chronic kidney disease:  Avoid nephrotoxic medications including nonsteroidal anti-inflammatory drugs.  1. Risk of systemic hypertension:  Target a blood pressure of less than 135/85 mm Hg.   2. Risk of malignancies:  A.  Complete skin examination at least once yearly.  B.  Regular use of sun block (of at least 15 SPF) and protective clothing.  C.  Remain up to date with all other age-appropriate cancer screening  3. Risk of infections:  A.  Remain up to date with all age-appropriate vaccinations (except avoid live virus vaccines).  B.  Pneumococcal vaccination:    i)  Assuming prior vaccination with Prevnar13 and  Pnemovax23, administer Pneumovax23 five years following last dose of Pneumovax23 and again final one at age 86 years of age.   ii) If no prior Prevnar13 vaccination and if more than one year following last Pneumovax23, then administer Prevnar13, followed by Pneumovax23 8 weeks or more later or 5 years after last Pneumovax23, and then final Pneumovax23 at age 43 years.  C.  Other preventative measures include: i) frequent hand washing (to reduce the risk of infection with pathogens acquired by direct contact), and ii) avoiding consumption of unpasteurized milk products, raw or undercooked eggs or meats, water from lakes or rivers.    4. Remain on a healthy diet, be physically active, and maintain a healthy weight.  5. Avoid tobacco (high risk of lung and head/neck cancers), alcohol (high risk of allograft dysfunction), and recreational drugs, including marijuana.     Ebony Woodard will return to the liver transplant clinic in 1 year for her annual follow up with laboratory studies, and imaging.     The patient was seen and evaluated with Hepatology Attending, Dr. Orest Dikes.

## 2016-10-22 NOTE — Patient Instructions (Signed)
POST LIVER TRANSPLANT CLINIC VISIT SUMMARY    We appreciated seeing you in our Post Liver Transplant Clinic today.    You were seen by: Hester MatesElsbeth Jensen-Otsu  Hepatology Attending: Debbra RidingIris Liou    Your transplant nurse coordinators are:  Ronne BinningJen Boyer, RN  Harless Nakayamaiana Miller, RN  [phone] (phone)  [phone] (fax)    ---------------------------------------------------------------------------------------------------------------------    MEDICATIONS:  No changes to your immunosuppression regimen were made today.    New prescriptions were not submitted today.    Your updated liver transplant medications/doses:  Tacrolimus 1 mg in the morning and 0.5 mg in the evening  Mycophenolate 250 mg twice a day  Ursodiol 500 mg twice a day      LABORATORY TESTS:  Please continue getting your blood tests (to include medication trough level, approximately 12 hours after your last dose of immunosuppresion, and before your next dose):     A) You are next due for laboratory testing in:  3 months.   B) Thereafter, these should be done every 3 months, or as directed as your transplant nurse coordinator.   C) New standing orders were given to you (for external lab).    You will receive a letter from our transplant program with your lab results after we receive and review your labs. We will contact you by phone if any medication dose changes or other instructions are needed.  Please contact your transplant nurse coordinator at [phone] if you do not receive this letter a few weeks after your blood tests are done to ensure that we have received them.    As a reminder, please repeat your blood tests within 2 to 4 weeks or as directed by your transplant nurse coordinator after any dose change in your immunosuppression medications.    OTHER RECOMMENDATIONS:      FOLLOW-UP:  Please return for a follow-up visit in our Post Liver Transplant Clinic in 1 year, when you are due for your annual tests, to include blood tests, 24 hour urine tests,  chest x-ray, liver ultrasound, and possible Fibroscan.    Please contact your transplant nurse coordinators at [phone] with any questions or concerns in the interim.

## 2016-11-16 NOTE — Progress Notes (Signed)
I saw and evaluated the patient. I have reviewed Dr. Jensen-Otsu's documentation and agree with it.

## 2017-01-17 ENCOUNTER — Other Ambulatory Visit (HOSPITAL_COMMUNITY): Payer: Self-pay

## 2017-01-17 LAB — HEPATIC FUNCTION PANEL, EXTERNAL
ALT (GPT), External: 32
AST (GOT), External: 29
Albumin, External: 4
Alkaline Phosphatase (Total), External: 237
Bilirubin (Direct), External: 0.2
Bilirubin (Total), External: 0.7
Protein (Total), External: 7

## 2017-01-17 LAB — CBC (HEMOGRAM), EXTERNAL
Hematocrit, External: 35.7
Hemoglobin, External: 11.6
MCH, External: 28.6
MCV, External: 87.9
Platelet Count, External: 269
WBC, External: 5.01

## 2017-01-17 LAB — BASIC METABOLIC PANEL, EXTERNAL
Calcium, External: 9.7
Carbon Dioxide (Total), External: 28
Chloride, External: 101
Creatinine, External: 1
Glucose, External: 96
Potassium, External: 3.6
Sodium, External: 140
Urea Nitrogen, External: 30

## 2017-01-17 LAB — CBC, DIFF, EXTERNAL
% Basophils, External: 0.4
% Eosinophils, External: 5.2
% Lymphocytes, External: 16.5
% Monocytes, External: 5.2
% Neutrophils, External: 71.6
Basophils, External: 0.02
Eosinophil Count, External: 0.26
Lymphocyte Count, External: 0.83
Monocytes, External: 0.26
Neutrophils, External: 3.59

## 2017-01-17 LAB — TACROLIMUS, EXTERNAL: Tacrolimus, External: 3.6

## 2017-01-17 LAB — PROTHROMBIN AND PTT, EXTERNAL
Prothrombin INR, External: 1
Prothrombin Time Patient, External: 13.3

## 2017-01-17 LAB — COMPREHENSIVE METABOLIC PANEL, EXTERNAL: GFR, Calc, European American, External: 54

## 2017-01-17 LAB — MAGNESIUM, EXTERNAL: Magnesium, External: 1.9

## 2017-01-17 LAB — GAMMA GLUTAMYL TRANSFERASE, EXTERNAL: Gamma Glutamyl Transferase, External: 75

## 2017-01-17 LAB — PHOSPHATE, EXTERNAL: Phosphate, External: 3.5

## 2017-04-25 ENCOUNTER — Other Ambulatory Visit (HOSPITAL_COMMUNITY): Payer: Self-pay

## 2017-04-25 LAB — TACROLIMUS, EXTERNAL: Tacrolimus, External: 3.6

## 2017-04-25 LAB — CBC (HEMOGRAM), EXTERNAL
Hematocrit, External: 37.5
Hemoglobin, External: 12.2
MCH, External: 28.4
MCV, External: 87.4
Platelet Count, External: 264
WBC, External: 5.73

## 2017-04-25 LAB — PHOSPHATE, EXTERNAL: Phosphate, External: 3.1

## 2017-04-25 LAB — BASIC METABOLIC PANEL, EXTERNAL
Calcium, External: 9.7
Carbon Dioxide (Total), External: 27
Chloride, External: 100
Creatinine, External: 1.1
Glucose, External: 108
Potassium, External: 3.4
Sodium, External: 137
Urea Nitrogen, External: 31

## 2017-04-25 LAB — COMPREHENSIVE METABOLIC PANEL, EXTERNAL: GFR, Calc, European American, External: 48

## 2017-04-25 LAB — CBC, DIFF, EXTERNAL
% Basophils, External: 0.6
% Eosinophils, External: 5.5
% Lymphocytes, External: 17.3
% Monocytes, External: 4.5
% Neutrophils, External: 70.6
Basophils, External: 0.03
Eosinophil Count, External: 0.32
Lymphocyte Count, External: 0.99
Monocytes, External: 0.26
Neutrophils, External: 4.05

## 2017-04-25 LAB — HEPATIC FUNCTION PANEL, EXTERNAL
ALT (GPT), External: 33
AST (GOT), External: 32
Albumin, External: 4.2
Alkaline Phosphatase (Total), External: 253
Bilirubin (Direct), External: 0.3
Bilirubin (Total), External: 0.8
Protein (Total), External: 7.4

## 2017-04-25 LAB — MAGNESIUM, EXTERNAL: Magnesium, External: 1.9

## 2017-04-25 LAB — PROTHROMBIN AND PTT, EXTERNAL
Prothrombin INR, External: 1.1
Prothrombin Time Patient, External: 13.6

## 2017-04-25 LAB — GAMMA GLUTAMYL TRANSFERASE, EXTERNAL: Gamma Glutamyl Transferase, External: 67

## 2017-08-16 ENCOUNTER — Other Ambulatory Visit (HOSPITAL_COMMUNITY): Payer: Self-pay

## 2017-08-16 LAB — HEPATIC FUNCTION PANEL, EXTERNAL
ALT (GPT), External: 36
AST (GOT), External: 35
Albumin, External: 4.1
Alkaline Phosphatase (Total), External: 262
Bilirubin (Direct), External: 0.2
Bilirubin (Total), External: 0.7
Protein (Total), External: 7.1

## 2017-08-16 LAB — PHOSPHATE, EXTERNAL: Phosphate, External: 3.2

## 2017-08-16 LAB — CBC (HEMOGRAM), EXTERNAL
Hematocrit, External: 33.4
Hemoglobin, External: 11.1
MCH, External: 29.1
MCV, External: 87.3
Platelet Count, External: 273
WBC, External: 5.06

## 2017-08-16 LAB — BASIC METABOLIC PANEL, EXTERNAL
Calcium, External: 9.4
Carbon Dioxide (Total), External: 26
Chloride, External: 102
Creatinine, External: 1
Glucose, External: 99
Potassium, External: 3.4
Sodium, External: 139
Urea Nitrogen, External: 29

## 2017-08-16 LAB — CBC, DIFF, EXTERNAL
% Basophils, External: 0.6
% Eosinophils, External: 5.6
% Lymphocytes, External: 19.8
% Monocytes, External: 4.9
% Neutrophils, External: 67.6
Basophils, External: 0.03
Eosinophil Count, External: 0.28
Lymphocyte Count, External: 1
Monocytes, External: 0.25
Neutrophils, External: 3.43

## 2017-08-16 LAB — MAGNESIUM, EXTERNAL: Magnesium, External: 1.9

## 2017-08-16 LAB — PROTHROMBIN AND PTT, EXTERNAL
Prothrombin INR, External: 1
Prothrombin Time Patient, External: 12.8

## 2017-08-16 LAB — TACROLIMUS, EXTERNAL: Tacrolimus, External: 4.2

## 2017-08-16 LAB — COMPREHENSIVE METABOLIC PANEL, EXTERNAL: GFR, Calc, European American, External: 54

## 2017-08-16 LAB — GAMMA GLUTAMYL TRANSFERASE, EXTERNAL: Gamma Glutamyl Transferase, External: 70

## 2017-10-24 ENCOUNTER — Other Ambulatory Visit (HOSPITAL_COMMUNITY): Payer: Self-pay

## 2017-10-24 LAB — LIPID PANEL, EXTERNAL
Cholesterol (HDL), External: 61
Cholesterol (LDL), External: 76
Cholesterol (Total), External: 156
Triglyceride, External: 97

## 2017-10-24 LAB — CBC, DIFF, EXTERNAL
% Basophils, External: 0.7
% Eosinophils, External: 4.9
% Lymphocytes, External: 18.3
% Monocytes, External: 4.8
% Neutrophils, External: 69.8
Basophils, External: 0.03
Eosinophil Count, External: 0.22
Lymphocyte Count, External: 0.83
Monocytes, External: 0.22
Neutrophils, External: 3.17

## 2017-10-24 LAB — GAMMA GLUTAMYL TRANSFERASE, EXTERNAL: Gamma Glutamyl Transferase, External: 58

## 2017-10-24 LAB — PHOSPHATE, EXTERNAL: Phosphate, External: 3.1

## 2017-10-24 LAB — BASIC METABOLIC PANEL, EXTERNAL
Calcium, External: 9.4
Carbon Dioxide (Total), External: 26
Chloride, External: 102
Creatinine, External: 1.1
Glucose, External: 99
Potassium, External: 3.5
Sodium, External: 138
Urea Nitrogen, External: 32

## 2017-10-24 LAB — CBC (HEMOGRAM), EXTERNAL
Hematocrit, External: 31.8
Hemoglobin, External: 10.3
MCH, External: 27.8
MCV, External: 85.8
Platelet Count, External: 229
WBC, External: 4.54

## 2017-10-24 LAB — PROTEIN/CREATININE RATIO, URN, EXTERNAL
Creatinine/24hr, URN, External: 738
Creatinine/Unit, URN, External: 36
Protein (Total), URN, External: 5
Protein (Total)/24 hr, URN, External: 103

## 2017-10-24 LAB — CREATININE CLEARANCE, URN, EXTERNAL: Total Volume, External: 2050

## 2017-10-24 LAB — PROTHROMBIN AND PTT, EXTERNAL
Prothrombin INR, External: 1
Prothrombin Time Patient, External: 12.7

## 2017-10-24 LAB — TACROLIMUS, EXTERNAL: Tacrolimus, External: 3.6

## 2017-10-24 LAB — MAGNESIUM, EXTERNAL: Magnesium, External: 1.7

## 2017-10-24 LAB — COMPREHENSIVE METABOLIC PANEL, EXTERNAL: GFR, Calc, European American, External: 49

## 2017-10-24 LAB — HEPATIC FUNCTION PANEL, EXTERNAL: Albumin, External: 4.1

## 2017-11-01 NOTE — Progress Notes (Signed)
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  -no vascular/thrombotic, or biliary complications or prior episodes of rejection       INTERVAL HISTORY  Ebony Woodard was last seen in Post Liver Transplant Clinic in 10/2016. In the interim, she has not had any hospitalizations, ER visits or major infections. She states that she is expecting TKR on 11/21/2017 and she is actively experiencing a lot of left knee pain but does not use cane or walker for ambulation. Never had any episodes of fall recently. The patient denies any fever, chills, nausea and vomiting, diarrhea, constipation, BRBPR, blood in stools, melena, abdominal pain, abdominal distension, lower extremity edema, confusion, sleep disturbances, and unexplained weight loss.     Vaccination status:  Influenza: up to date as per patient.   Pneumococcal:  Completed.    Preventative care:  Sun protection/annual skin examination: up to date on annual skin examination in 09/2017. No skin lesions concerning for HCC.   Colorectal cancer screening: Up to date as per Dr. Tommie Sams.  Breast cancer screening/Pap smear (if applicable): mammogram due for one in 10/2017.      Dietary habits, physical activity and weight change: weight is stable. No definitive change in dietary habits and physical activity tolerance.    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      Atorvastatin Calcium 10 MG Oral Tab Take 10 mg by mouth.      Calcium Carb-Cholecalciferol (CALCIUM 500 +D) 500-400 MG-UNIT Oral Tab Take 1 tablet by mouth 3 times a day.      Docusate Sodium 100 MG Oral Cap Take 100-200 mg by mouth.      Hydrochlorothiazide 12.5 MG Oral Tab       Multiple Vitamin (MULTI-VITAMIN DAILY) Oral Tab Take 1 tablet by mouth.      Mycophenolate Mofetil 250 MG Oral Cap Take 250 mg by mouth 2 times a day.      Polysaccharide Iron Complex 150 MG Oral Cap Take 150 mg by mouth.      Tacrolimus 0.5 MG Oral Cap Take 1 capsule (0.5 mg) by mouth every evening. Or as directed by provider. 90 capsule 3    Tacrolimus 1 MG Oral Cap Take 1 capsule (1 mg) by mouth every morning. 90 capsule 3    Ursodiol 500 MG Oral Tab Take 500 mg by mouth 2 times a day.       No current facility-administered medications for this visit.  FAMILY HISTORY      Family history of liver disease:  N/A    SOCIAL HISTORY  Social History     Tobacco Use    Smoking status: Never Smoker   Substance Use Topics    Alcohol use: No    Drug use: No       REVIEW OF SYSTEMS  Constitutional: Negative for fever  Eyes: Negative for scleral icterus   Cardiovascular: Positive for peripheral edema mild degree on her ankle.  Respiratory: Negative for shortness of breath  Gastrointestinal: Negative, no significant dyspepsia, heartburn, constipation, diarrhea.  Musculoskeletal: Negative for muscle wasting  Skin: Negative for jaundice  Neurological: Negative for asterixis  Psychiatric: Negative for sleep disturbance  Hematologic/Lymphatic: Negative for excessive bleeding  Allergic/Immunologic: Negative for recurrent  infections    PHYSICAL EXAMINATION     Wt Readings from Last 5 Encounters:   11/08/17 189 lb 6.4 oz (85.9 kg)   10/22/16 194 lb 3.6 oz (88.1 kg)   08/08/15 189 lb 9.5 oz (86 kg)   07/19/14 199 lb 11.2 oz (90.6 kg)   06/01/13 205 lb 11 oz (93.3 kg)       Vital Signs:  BP (!) 146/68    Pulse 90    Temp 98.1 F (36.7 C) (Temporal)    Ht 5' 6.5" (1.689 m)    Wt 189 lb 6.4 oz (85.9 kg)    SpO2 100% Comment: ra   BMI 30.11 kg/m .  General Appearance: well nourished, well developed, no distress, cooperative. Eyes: lids normal, anicteric  Ears: helices well-formed, ears in normal position, hearing grossly intact  Nose: normal, no discharge Mouth/Throat: lips normal, tongue normal, moist mucus membranes Neck: supple, no lymphadenopathy Chest/Breasts: no gynecomastia, no masses, no discharge  Respiratory: normal effort, good excursion Cardiovascular: no  pedal edema  Gastrointestinal: soft, nontender, no guarding, nondistended  Genitourinary: not examined  Back: symmetric, normal lumbar and thoracic curvatures  Lymphatic: not examined  Musculoskeletal: normal muscle bulk without wasting, muscular strength intact, stable when upright  Extremities: no  pedal edema  Skin: no jaundice, no palmar erythema, no spider angiomas Neurologic: no asterixis, no tremor Psychiatric: normal affect, normal judgment, normal insight    LABORATORY TESTS/STUDIES            24h urine CrCl and total protein - 10/24/2017        CXR - 10/18/2017  FINDINGS:   Volume: Normal.    Lungs/Pleura: 3 mm left upper lobe lateral calcified granuloma again  seen. No new or noncalcified nodules, consolidation, or edema. No  effusion or pneumothorax. Normal pulmonary vasculature.     Mediastinum: No adenopathy or cardiac enlargement.     Bones: Unremarkable.     Other: None.     IMPRESSION:   1. No acute cardiopulmonary disease.  2. Stable 3 mm calcified granuloma in left upper lobe.    Abdominal US with Doppler - 10/18/2017  FINDINGS:   Liver: Normal size with  right lobe 16.2 cm superior to inferior.  Nonspecific hepatic parenchymal heterogeneity. No mass suspicious for  malignancy. Mild nonspecific areas of liver capsular contour  lobulation. Hepatic veins patent with normal directional flow. The  intrahepatic IVC patent. Portal vein flow hepatopedal. Main hepatic  arterial peak systolic velocity 43.7 cm/s.    Gallbladder: Cholecystectomy.     Biliary System: Common bile duct 4.6 mm diameter. Common hepatic duct  2.1 mm diameter. Mild central intrahepatic ductal prominence. No  intraductal stones seen.    Pancreas:  Partially obscured by bowel gas. No pancreatic mass or main  pancreatic duct dilation. Probable enlarged portacaval lymph node 2.5  x 2 x 2.4 cm.    Right Kidney: 9.2 cm longitudinally. No shadowing stones or  hydronephrosis. 1.9 cm cyst. No solid masses.     Spleen: 14.2 x 6.0 x 6.9 cm. Volume 310 cc. No focal mass.    Free Fluid: None.     Other: No gross varices seen.    IMPRESSION:   1. Hepatic parenchymal heterogeneity again seen in transplant liver.  No focal mass suspicious for malignancy. Mild interval increase in  central intrahepatic ductal prominence without extrahepatic duct  dilation. Liver otherwise unremarkable  2. Hepatic arterial and venous vasculature appears patent with normal  directional flow  3. Increased Splenomegaly. Volume approximately 310 cc, previously 218  cc. No ascites.  4. Exam otherwise as above.      Last liver biopsy - 06/23/2004  FINAL DIAGNOSIS:  [facility] [place], [place]; [phone] (06/23/2004)  Liver, left lobe, needle core biopsy:  1.   Focal mild to moderate portal lymphoplasmacytic inflammation and single  focus of portal granulomatous inflammation.  2.   Mild focal lobular inflammation and hepatocyte dropout.    3.   Possible single focus of endotheliitis.  See comment.  4.   Moderate intrahepatocytic iron deposition, confirmed by examination of the  iron stained slide.  5.   Results of  examination of the trichrome, reticulin and iron stains are  detailed in the Microscopic Description above and are incorporated into the  diagnosis.      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    1.  Liver graft function/immunosuppression: Tacrolimus level of 3.6 on 10/24/2017  A.  Continue tacrolimus at the unchanged dose of 1mg  PO qAM and 0.5mg  PO qPM.  B.  Other immunosuppression: MMF 250mg  PO BID  C.  Frequency of lab testing: q 3 months  D.  Next lab draw due: 01/2018    2.  (Risk of) Recurrent disease: She has recurrent PBC since 2010 with prior normalization of liver enzymes with ursodiol. She remains on ursodiol 500 mg BID (~11.5 mg/kg) with recent slight increase in her alkaline phosphatase, which we would recommend monitoring for now.    3.  Transplant-related complications: Please refer to Transplant Roadmap section above for further details. No hx of rejections nor known biliary/vascular complications.    4.   Imaging:         A. For all patients:  Repeat CXR, liver ultrasound with Doppler, and possible             Fibroscan in one year.         B.  APPLICABLE? NO:  For patients at high risk for recurrence of   hepatocellular carcinoma based on pretransplant or explant characteristics,   continue dynamic contrast enhanced cross-sectional imaging (e.g. Multiphase   abdominal CT or MRIscan) for [facility] surveillance until year 5 following   transplantation due to risk of both intra and extrahepatic recurrence.    5.  Healthcare maintenance: Update of age/gender appropriate vaccinations and necessary cancer surveillance, further arrangement per PCP.    6.  CKD: she has CKD with reported baseline Cr of 1.4, but creatinine has been stable over the last few years at 1.0.  GENERAL RECOMMENDATIONS  1.  Risk of chronic kidney disease:  Avoid  nephrotoxic medications including nonsteroidal anti-inflammatory drugs.  1. Risk of systemic hypertension:  Target a blood pressure of less than 135/85 mm Hg.   2. Risk of malignancies:  A.  Complete skin examination at least once yearly.  B.  Regular use of sun block (of at least 15 SPF) and protective clothing.  C.  Remain up to date with all other age-appropriate cancer screening  3. Risk of infections:  A.  Remain up to date with all age-appropriate vaccinations (except avoid live virus vaccines).  B.  Pneumococcal vaccination:    i)  Assuming prior vaccination with Prevnar13 and Pnemovax23, administer Pneumovax23 five years following last dose of Pneumovax23 and again final one at age 77 years of age.   ii) If no prior Prevnar13 vaccination and if more than one year following last Pneumovax23, then administer Prevnar13, followed by Pneumovax23 8 weeks or more later or 5 years after last Pneumovax23, and then final Pneumovax23 at age 77 years.  C.  Other preventative measures include: i) frequent hand washing (to reduce the risk of infection with pathogens acquired by direct contact), and ii) avoiding consumption of unpasteurized milk products, raw or undercooked eggs or meats, water from lakes or rivers.    4. Remain on a healthy diet, be physically active, and maintain a healthy weight.  5. Avoid tobacco (high risk of lung and head/neck cancers), alcohol (high risk of allograft dysfunction), and recreational drugs, including marijuana.     Ebony Woodard will return to the liver transplant clinic in one year for her annual follow up with laboratory studies, and imaging.     Kai LevinsMin Tennille Montelongo, ARNP   Hepatology and Liver Transplant  [place] [facility]

## 2017-11-02 DIAGNOSIS — Z944 Liver transplant status: Secondary | ICD-10-CM | POA: Insufficient documentation

## 2017-11-02 DIAGNOSIS — D849 Immunodeficiency, unspecified: Secondary | ICD-10-CM | POA: Insufficient documentation

## 2017-11-08 ENCOUNTER — Ambulatory Visit: Payer: Medicare Other | Attending: Transplant | Admitting: Transplant

## 2017-11-08 VITALS — BP 146/68 | HR 90 | Temp 98.1°F | Ht 66.5 in | Wt 189.4 lb

## 2017-11-08 DIAGNOSIS — Z6830 Body mass index (BMI) 30.0-30.9, adult: Secondary | ICD-10-CM

## 2017-11-08 DIAGNOSIS — Z79899 Other long term (current) drug therapy: Secondary | ICD-10-CM

## 2017-11-08 DIAGNOSIS — Z4823 Encounter for aftercare following liver transplant: Secondary | ICD-10-CM | POA: Insufficient documentation

## 2017-11-08 DIAGNOSIS — Z944 Liver transplant status: Secondary | ICD-10-CM | POA: Insufficient documentation

## 2017-11-08 DIAGNOSIS — D849 Immunodeficiency, unspecified: Secondary | ICD-10-CM

## 2017-11-08 DIAGNOSIS — D899 Disorder involving the immune mechanism, unspecified: Secondary | ICD-10-CM | POA: Insufficient documentation

## 2017-11-08 DIAGNOSIS — Z5181 Encounter for therapeutic drug level monitoring: Secondary | ICD-10-CM

## 2017-11-08 NOTE — Patient Instructions (Signed)
POST LIVER TRANSPLANT CLINIC VISIT SUMMARY    We appreciated seeing you in our Post Liver Transplant Clinic today.    You were seen by:  Kai LevinsMin Torrance Frech, ARNP    Your transplant nurse coordinators are:  Ronne BinningJen Boyer, RN  Harless Nakayamaiana Miller, RN  [phone] (phone)  [phone] (fax)    ---------------------------------------------------------------------------------------------------------------------    MEDICATIONS:  No changes to your immunosuppression regimen were made today.    New prescriptions were not submitted today.    Your updated liver transplant medications/doses:  1. Tacrolimus 1mg  in the morning and 0.5mg  in the evening.  2. MMF 250mg  twice daily.      LABORATORY TESTS:  Please continue getting your blood tests (to include medication trough level, approximately 12 hours after your last dose of immunosuppresion, and before your next dose):     A) You are next due for laboratory testing in:  01/2018.   B) Thereafter, these should be done every 3 months, or as directed as your transplant nurse coordinator.   C) New standing orders were given to you (for external lab).    You will receive a letter from our transplant program with your lab results after we receive and review your labs. We will contact you by phone if any medication dose changes or other instructions are needed.  Please contact your transplant nurse coordinator at [phone] if you do not receive this letter a few weeks after your blood tests are done to ensure that we have received them.    As a reminder, please repeat your blood tests within 2 to 4 weeks or as directed by your transplant nurse coordinator after any dose change in your immunosuppression medications.      OTHER RECOMMENDATIONS:  1. Continue with your current immunosuppression. No changes made today.  2. Repeat labs every 3 months.  3. Repeat abdominal ultrasound with doppler, chest x-ray, and 24 hour urine study in about a year prior to your next return clinic visit.  4. Good luck  on your upcoming knee replacement surgery.  5. Continue to avoid alcohol, herbal supplements and illicit drugs.      FOLLOW-UP:  Please return for a follow-up visit in our Post Liver Transplant Clinic in one year, when you are due for your annual tests, to include blood tests, 24 hour urine tests, chest x-ray, liver ultrasound, and possible Fibroscan.    Please contact your transplant nurse coordinators at [phone] with any questions or concerns in the interim.

## 2018-01-08 ENCOUNTER — Inpatient Hospital Stay: Payer: Self-pay

## 2018-01-17 ENCOUNTER — Telehealth (HOSPITAL_BASED_OUTPATIENT_CLINIC_OR_DEPARTMENT_OTHER): Payer: Self-pay | Admitting: Gastroenterology

## 2018-01-17 ENCOUNTER — Other Ambulatory Visit (HOSPITAL_COMMUNITY): Payer: Self-pay

## 2018-01-17 LAB — BASIC METABOLIC PANEL, EXTERNAL
Calcium, External: 9.1
Carbon Dioxide (Total), External: 29
Chloride, External: 94
Creatinine, External: 1.2
Glucose, External: 94
Potassium, External: 2.8
Sodium, External: 137
Urea Nitrogen, External: 20

## 2018-01-17 LAB — HEPATIC FUNCTION PANEL, EXTERNAL
ALT (GPT), External: 8
AST (GOT), External: 17
Albumin, External: 3.4
Alkaline Phosphatase (Total), External: 228
Bilirubin (Direct), External: 0.3
Bilirubin (Total), External: 0.7
Protein (Total), External: 6.4

## 2018-01-17 LAB — CBC, DIFF, EXTERNAL
% Basophils, External: 0.6
% Eosinophils, External: 2.9
% Lymphocytes, External: 21.7
% Monocytes, External: 4
% Neutrophils, External: 69.8
Basophils, External: 0.03
Eosinophil Count, External: 0.13
Lymphocyte Count, External: 0.94
Monocytes, External: 0.17
Neutrophils, External: 3.02

## 2018-01-17 LAB — GAMMA GLUTAMYL TRANSFERASE, EXTERNAL: Gamma Glutamyl Transferase, External: 91

## 2018-01-17 LAB — COMPREHENSIVE METABOLIC PANEL, EXTERNAL: GFR, Calc, European American, External: 44

## 2018-01-17 LAB — CBC (HEMOGRAM), EXTERNAL
Hematocrit, External: 32.3
Hemoglobin, External: 10.5
MCH, External: 27.2
MCV, External: 83.4
Platelet Count, External: 284
WBC, External: 4.33

## 2018-01-17 LAB — PHOSPHATE, EXTERNAL: Phosphate, External: 2.7

## 2018-01-17 LAB — PROTHROMBIN AND PTT, EXTERNAL
Prothrombin INR, External: 1.1
Prothrombin Time Patient, External: 13.9

## 2018-01-17 LAB — TACROLIMUS, EXTERNAL: Tacrolimus, External: 5

## 2018-01-17 LAB — MAGNESIUM, EXTERNAL: Magnesium, External: 1.3

## 2018-01-17 NOTE — Telephone Encounter (Signed)
RETURN CALL: Voicemail - Detailed Message      SUBJECT:  Results     MESSAGE: [facility] calling regarding critical test results for Dr. Ronny Bacon, potassium results. Urgent that Dr. Ronny Bacon contacts them immediately.

## 2018-04-28 ENCOUNTER — Other Ambulatory Visit (HOSPITAL_COMMUNITY): Payer: Self-pay

## 2018-04-28 LAB — COMPREHENSIVE METABOLIC PANEL, EXTERNAL: GFR, Calc, European American, External: 45

## 2018-04-28 LAB — CBC, DIFF, EXTERNAL
% Basophils, External: 0.5
% Eosinophils, External: 4.9
% Lymphocytes, External: 22.4
% Monocytes, External: 7.9
% Neutrophils, External: 62.5
Basophils, External: 0.02
Eosinophil Count, External: 0.15
Lymphocyte Count, External: 0.71
Monocytes, External: 0.25
Neutrophils, External: 1.97

## 2018-04-28 LAB — HEPATIC FUNCTION PANEL, EXTERNAL
ALT (GPT), External: 31
AST (GOT), External: 40
Albumin, External: 3.8
Alkaline Phosphatase (Total), External: 310
Bilirubin (Direct), External: 0.2
Bilirubin (Total), External: 0.7
Protein (Total), External: 6.8

## 2018-04-28 LAB — BASIC METABOLIC PANEL, EXTERNAL
Calcium, External: 9.5
Carbon Dioxide (Total), External: 29
Chloride, External: 98
Creatinine, External: 1.2
Glucose, External: 97
Potassium, External: 3.5
Sodium, External: 138
Urea Nitrogen, External: 26

## 2018-04-28 LAB — PROTHROMBIN AND PTT, EXTERNAL
Prothrombin INR, External: 1
Prothrombin Time Patient, External: 12.7

## 2018-04-28 LAB — CBC (HEMOGRAM), EXTERNAL
Hematocrit, External: 30.7
Hemoglobin, External: 9.8
MCH, External: 27.1
MCV, External: 85.2
Platelet Count, External: 187
WBC, External: 3.16

## 2018-04-28 LAB — MAGNESIUM, EXTERNAL: Magnesium, External: 1.6

## 2018-04-28 LAB — TACROLIMUS, EXTERNAL: Tacrolimus, External: 3.5

## 2018-04-28 LAB — GAMMA GLUTAMYL TRANSFERASE, EXTERNAL: Gamma Glutamyl Transferase, External: 55

## 2018-04-28 LAB — PHOSPHATE, EXTERNAL: Phosphate, External: 3.4

## 2018-08-17 ENCOUNTER — Inpatient Hospital Stay: Payer: Self-pay

## 2019-01-02 ENCOUNTER — Ambulatory Visit (HOSPITAL_BASED_OUTPATIENT_CLINIC_OR_DEPARTMENT_OTHER): Payer: Medicare Other | Admitting: Physician Assistant

## 2019-01-30 ENCOUNTER — Other Ambulatory Visit (HOSPITAL_COMMUNITY): Payer: Self-pay

## 2019-01-30 LAB — CBC, DIFF, EXTERNAL
% Basophils, External: 0.6
% Eosinophils, External: 4.5
% Lymphocytes, External: 26.7
% Monocytes, External: 7.4
% Neutrophils, External: 60.5
Basophils, External: 0.02
Eosinophil Count, External: 0.14
Lymphocyte Count, External: 0.83
Monocytes, External: 0.23
Neutrophils, External: 1.88

## 2019-01-30 LAB — CBC (HEMOGRAM), EXTERNAL
Hematocrit, External: 29.6
Hemoglobin, External: 9.5
MCH, External: 27.6
MCV, External: 86
Platelet Count, External: 146
WBC, External: 3.11

## 2019-01-30 LAB — MAGNESIUM, EXTERNAL: Magnesium, External: 1.9

## 2019-01-30 LAB — HEPATIC FUNCTION PANEL, EXTERNAL
ALT (GPT), External: 34
AST (GOT), External: 42
Albumin, External: 4
Alkaline Phosphatase (Total), External: 261
Bilirubin (Direct), External: 0.3
Bilirubin (Total), External: 0.8
Protein (Total), External: 7.2

## 2019-01-30 LAB — LIPID PANEL, EXTERNAL
Cholesterol (HDL), External: 65
Cholesterol (LDL), External: 78
Cholesterol (Total), External: 158
Triglyceride, External: 74

## 2019-01-30 LAB — GAMMA GLUTAMYL TRANSFERASE, EXTERNAL: Gamma Glutamyl Transferase, External: 40

## 2019-01-30 LAB — BASIC METABOLIC PANEL, EXTERNAL
Calcium, External: 9.6
Carbon Dioxide (Total), External: 26
Chloride, External: 103
Creatinine, External: 1
Glucose, External: 99
Potassium, External: 3.6
Sodium, External: 139
Urea Nitrogen, External: 36

## 2019-01-30 LAB — PROTEIN/CREATININE RATIO, URN, EXTERNAL
Creatinine/24hr, URN, External: 2349
Creatinine/Unit, URN, External: 87
Protein (Total), URN, External: 14
Protein/Creatinine Ratio, External: 0.2

## 2019-01-30 LAB — PROTHROMBIN AND PTT, EXTERNAL
Prothrombin INR, External: 1
Prothrombin Time Patient, External: 13.4

## 2019-01-30 LAB — PHOSPHATE, EXTERNAL: Phosphate, External: 3.5

## 2019-01-30 LAB — TACROLIMUS, EXTERNAL: Tacrolimus, External: 3.9

## 2019-01-30 LAB — CREATININE CLEARANCE, URN, EXTERNAL
Creatinine Clearance, External: 149
Total Volume, External: 2700

## 2019-01-30 LAB — COMPREHENSIVE METABOLIC PANEL, EXTERNAL: GFR, Calc, European American, External: 52

## 2019-01-30 NOTE — Progress Notes (Signed)
Tacrolimus dose change per [facility] transplant clinic note

## 2019-02-13 ENCOUNTER — Ambulatory Visit: Payer: Medicare Other | Attending: Physician Assistant | Admitting: Physician Assistant

## 2019-02-13 DIAGNOSIS — K743 Primary biliary cirrhosis: Secondary | ICD-10-CM | POA: Insufficient documentation

## 2019-02-13 DIAGNOSIS — D849 Immunodeficiency, unspecified: Secondary | ICD-10-CM | POA: Insufficient documentation

## 2019-02-13 DIAGNOSIS — Z4823 Encounter for aftercare following liver transplant: Secondary | ICD-10-CM | POA: Insufficient documentation

## 2019-02-13 NOTE — Progress Notes (Signed)
This visit is being conducted over the telephone at the patients request: Yes  Patient gives verbal consent to proceed and knows there may be a copay/deductible: Yes    Time spent with patient/guardian on this telephone visit: 12 minutes    Given the importance of social distancing and other strategies recommended to reduce the risk of COVID-19 transmission, I am providing medical care to this patient via a telephone visit in place of an in person visit at the request of the patient.      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   -She has had no vascular/thrombotic, or biliary complications or prior episodes of rejection.    INTERVAL HISTORY  Ebony Woodard was last seen in [facility] on (7.30.2019). In the interim:  Feels well. No complaints. Underwent Elective knee surgery, left total knee (8.12.2019).  Fell and injured left hip (5.2020) s/p total left hip; good recovery. Shingrix x2. Observing social/physical distancing. No influenza like illnesses. Does not endorse fever, rigors, melena,hematochezia, hematemesis, jaundice, abdominal pain.    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      Atorvastatin Calcium 10 MG Oral Tab Take 10 mg by mouth.      Calcium Carb-Cholecalciferol (CALCIUM 500 +D) 500-400 MG-UNIT Oral Tab Take 1 tablet by mouth 3 times a day.      Docusate Sodium 100 MG Oral Cap Take 100-200 mg by mouth.      Hydrochlorothiazide 12.5 MG Oral Tab       Multiple Vitamin (MULTI-VITAMIN DAILY) Oral Tab Take 1 tablet by mouth.      Mycophenolate Mofetil 250 MG Oral Cap Take 250 mg by mouth 2 times a day.      Polysaccharide Iron Complex 150 MG Oral Cap Take 150 mg by mouth.      Tacrolimus 0.5 MG Oral Cap Take 1 capsule (0.5 mg) by mouth every evening. Or as directed by provider. 90 capsule 3    Tacrolimus 1 MG Oral Cap Take 1 capsule (1 mg) by mouth every morning. 90 capsule 3    Ursodiol 500 MG Oral Tab Take 500  mg by mouth 2 times a day.       No current facility-administered medications for this visit.        SOCIAL HISTORY  Social History     Tobacco Use    Smoking status: Never Smoker   Substance Use Topics    Alcohol use: No    Drug use: No       REVIEW OF SYSTEMS  Complete review of systems is negative except as noted in Interval History       LABORATORY TESTS/STUDIES    01/30/2019 10:25:00 AM  External   Panel/Test Result Ref. Range   Immunosuppression      Tacro level 3.9     01/30/2019 10:24:00 AM  External   Panel/Test Result Ref. Range   24 Hour Urine      CrCl 24hr (ml/min) 149        Creatinine 24hr 2349        Ur Creatinine (mg/dl) 87        Ur Pr/Cr 0.2        Ur Protein (mg/dl) 14        Volume 24hr 2700     Chemistry      BUN 36         CHOL 158        CO2 26        Ca 9.6        Cl 103        Cr 1        GLU 99        HDL 65        K+ 3.6        LDL 78        Mg 1.9        Na 139        P 3.5        TRIG 74        eGFR 52     Coagulation      INR 1        PT 13.4     Hematology      Baso % 0.6        Baso Abs 0.02        EOS % 4.5        EOS Abs 0.14        Hct 29.6        Hgb 9.5        Lymphs % 26.7        Lymphs Abs 0.83        MCH 27.6        MCV 86        Mono % 7.4        Mono Abs 0.23        Neutro % 60.5        Neutro Abs 1.88        Plt. 146        WBC 3.11     Liver Function      ALB 4        ALT 34        AP 261        AST 42        BILI 0.8        DBILI 0.3        GGT 40        Serum Protein 7.2  CXR(10.28.2020):   1. Small band-like focus of increased opacity in the region of medial segment right middle lobe. Subsegmental atelectasis could have this appearance. Clinical correlation needed.  2. Stable, calcified left lung nodule, consistent with granuloma. No new nodules.  3. Exam otherwise as above.    Abdominal US with Doppler(10.28.2020):    1. Nonspecific liver parenchymal heterogeneity within otherwise unremarkable transplant liver, similar to prior study. Liver Doppler evaluation dictated separately.  2. 7 mm cyst at inferior margin of body of pancreas, not previously seen. Pancreas partially obscured by bowel gas. Characteristics not suggestive of malignancy. Follow-up in 12 months recommended to assess stability. Consideration of MRI or CT as imaging modality for follow-up recommended.  3. Splenomegaly and trace ascites.  4. Exam otherwise as above.    Last liver biopsy(3.14.2006):   MICROSCOPIC DESCRIPTION:  H&E-stained sections show liver parenchyma with mild to moderate portal  inflammation composed predominantly of mature lymphocytes with occasional plasma  cells.  Within one portal tract, there is a slightly more abundant population of  lymphocytes and plasma cells, as well as a single focus of  granulomatous  inflammation, consisting of a loose aggregate of epithelioid histiocytes.  In  this same portal region, there is a single focus of possible mild endotheliitis.  There is a mild degree of interface hepatitis.  Rare lymphocytes are present  within bile ducts, but no significant atypia of bile ductular epithelium is  seen.  Occasional small foci of lobular inflammation are present, including  lymphocytes and neutrophils, with associated mild hepatocyte dropout.  Trichrome  stain shows no significant fibrosis, and reticulin stain shows normal  architecture.  The central veins, hepatic arteries and portal veins appear  normal. The central regions show no significant edema, steatosis, cholestasis,  ground glass cells, or Mallory hyaline.  Iron stain shows moderate  intrahepatocytic iron deposition.    FINAL DIAGNOSIS:  Liver, left lobe, needle core biopsy:  1.   Focal mild to moderate portal lymphoplasmacytic inflammation and single  focus of portal granulomatous inflammation.  2.   Mild focal lobular inflammation and hepatocyte dropout.    3.   Possible single focus of endotheliitis.  See comment.  4.   Moderate intrahepatocytic iron deposition, confirmed by examination of the  iron stained slide.  5.   Results of examination of the trichrome, reticulin and iron stains are  detailed in the Microscopic Description above and are incorporated into the  diagnosis.      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       1.  Liver graft function/immunosuppression: Most recent liver panel demonstrates intact synthetic function. ALT is 34 in the setting of mild chronic elevations. Tacrolimus  level is 3.9 ng/ml.     A.  Continue tacrolimus at the unchanged dose of 1 mg every am and 0.5 mg  every pm.   B.  Other immunosuppression: MMF 250 mg BID  C.  Frequency of lab testing: every 3 months  D.  Next lab draw due: 1.2021  E.  Return to clinic in one year.    2.  (Risk of) Recurrent disease: Recurrent PBC. Stable elevations of alk phos. On ursodiol.    3.  Pancreatic cyst: new, 7 mm. Defer to primary providers for monitoring.  4. Anemia: asymptomatic. Progressive since at least 1.2019.  -follow-up with primary providers for evaluation and management.    5. 24 hour urine: appear to be an over collection or lab error.  -repeat routinely with primary providers or repeat next year for long term follow-up visit.    6.  Imaging:         A. For all patients:  Repeat CXR, liver ultrasound with Doppler, and possible             Fibroscan in one year.           7.  General medical care, screening, and health maintenance to continue through primary care provider.      GENERAL RECOMMENDATIONS    -Risk of chronic kidney disease:  Avoid nephrotoxic medications including nonsteroidal anti-inflammatory drugs.  -Risk of systemic hypertension:  Target a blood pressure of less than 135/85 mm Hg.   -Risk of malignancies:  A.  Complete skin examination at least once yearly.  B.  Regular use of sun block (of at least 50 SPF) and protective clothing.  C.  Remain up to date with all other age-appropriate cancer screening  -Risk of infections:  A.  Remain up to date with all age-appropriate vaccinations (except avoid live virus vaccines).  B.  Influenza vaccination: Yearly seasonal influenza vaccination; high dose. (avoid live virus preparations)  C.  Pneumococcal vaccination:    i)  Assuming prior vaccination with Prevnar13 and Pnemovax23, administer Pneumovax23 five years following last dose of Pneumovax23 and again (final one) at age 72.   ii) If no prior Prevnar13 vaccination and if more than one year following last Pneumovax23: Administer Prevnar13, followed by Pneumovax23 (no earlier than 8 weeks later) OR 5 years after  last Pneumovax23 and then final Pneumovax23 at age 75.  D.  Other preventative measures include: i) frequent hand washing (to reduce the risk of infection with pathogens acquired by direct contact), and ii) avoiding consumption of unpasteurized milk products, raw or undercooked eggs or meats, water from lakes or rivers. Please see the document entitled Food Safety For Transplant Recipients which may be found at the following site:      DisplaySpy.ca.pdf    -Remain on a healthy diet, be physically active, and maintain a healthy weight.  -Avoid tobacco (high risk of lung and head/neck cancers), alcohol (high risk of allograft dysfunction), and recreational drugs, including marijuana.

## 2019-05-14 ENCOUNTER — Other Ambulatory Visit (HOSPITAL_COMMUNITY): Payer: Self-pay

## 2019-05-14 LAB — CBC, DIFF, EXTERNAL
% Basophils, External: 0.8
% Eosinophils, External: 3.2
% Lymphocytes, External: 34.4
% Monocytes, External: 7.9
% Neutrophils, External: 53.7
Basophils, External: 0.02
Eosinophil Count, External: 0.08
Lymphocyte Count, External: 0.87
Monocytes, External: 0.2
Neutrophils, External: 1.36

## 2019-05-14 LAB — BASIC METABOLIC PANEL, EXTERNAL
Calcium, External: 10
Carbon Dioxide (Total), External: 28
Chloride, External: 101
Creatinine, External: 1.1
Glucose, External: 96
Potassium, External: 3.5
Sodium, External: 139
Urea Nitrogen, External: 39

## 2019-05-14 LAB — CBC (HEMOGRAM), EXTERNAL
Hematocrit, External: 28.9
Hemoglobin, External: 9.1
MCH, External: 26.8
MCV, External: 85.3
Platelet Count, External: 120
WBC, External: 2.53

## 2019-05-14 LAB — HEPATIC FUNCTION PANEL, EXTERNAL
ALT (GPT), External: 23
AST (GOT), External: 34
Albumin, External: 3.8
Alkaline Phosphatase (Total), External: 211
Bilirubin (Direct), External: 0.2
Bilirubin (Total), External: 0.7
Protein (Total), External: 7.5

## 2019-05-14 LAB — MAGNESIUM, EXTERNAL: Magnesium, External: 1.6

## 2019-05-14 LAB — TACROLIMUS, EXTERNAL: Tacrolimus, External: 3.9

## 2019-05-14 LAB — PHOSPHATE, EXTERNAL: Phosphate, External: 3.2

## 2019-05-14 LAB — COMPREHENSIVE METABOLIC PANEL, EXTERNAL: GFR, Calc, European American, External: 49

## 2019-05-14 LAB — GAMMA GLUTAMYL TRANSFERASE, EXTERNAL: Gamma Glutamyl Transferase, External: 29

## 2019-07-06 ENCOUNTER — Encounter (HOSPITAL_COMMUNITY): Payer: Self-pay

## 2019-07-07 NOTE — Historical Transplant Communications (Signed)
Mays, Danella Marie ([id number])  Transplant Communications  ____________________________________________________________________________________    Date: 11/22/2018  Type: Action  Entry By: [email or web address]  Dasiah called regarding receiving a LTFU letter in the mail. She was wondering, given her age, if she is able to do a telemedicine visit. To Karisa and Lisa.   ____________________________________________________________________________________    Date: 11/16/2018  Type: Letter sent  Entry By: [email or web address]  2020 LTFU REMINDER LETTER SENT   ____________________________________________________________________________________    Date: 01/17/2018  Type: Medical info  Entry By: boyerjen  Received critical K+ of 2.8, call to pt to call PCP for treatment.   ____________________________________________________________________________________    Date: 09/02/2017  Type: Letter sent  Entry By: jowill  2019 LTFU REMINDER LETTER SENT.  ____________________________________________________________________________________    Date: 08/26/2016  Type: Action  Entry By: jowill  Spoke to patient re ltfu. She will be travelling several times back and forth to [place]. LTFU clinic scheduled for 7/13 per pt request. She will be doing her testing locally w/Dr. Gottschlich. Will fax pcp ltfu test request and cc copy to patient.  ____________________________________________________________________________________    Date: 06/10/2016  Type: Action  Entry By: gibbok  Susan returned my call. She will repeat labs next week. Orders faxed to [facility] [place]. Reminder set to f/u.   ____________________________________________________________________________________    Date: 06/09/2016  Type: Action  Entry By: gibbok  Left another VM requesting a call back regarding needing repeat labs. Reminder set to f/u.   ____________________________________________________________________________________    Date: 06/07/2016  Type: Action  Entry By: gibbok  Left  another VM requesting a call back regarding needing repeat labs. Alternate contact number is a business, and not a personal number for Nakaya. Reminder set to f/u if not contacted.   ____________________________________________________________________________________    Date: 06/01/2016  Type: Action  Entry By: gibbok  LMTCB with patient regarding needing repeat labs. Reminder set to f/u if not contacted.   ____________________________________________________________________________________    Date: 06/04/2015  Type: Letter sent  Entry By: jowill  2017 LTFU REMINDER LETTER SENT.  ____________________________________________________________________________________    Date: 08/28/2014  Type: Action  Entry By: gibbok  Zonia returned my call. She had labs drawn 4/26 and is going tomorrow for labs. I will contact [facility] in [place] to get these results.   ____________________________________________________________________________________    Date: 08/27/2014  Type: Action  Entry By: gibbok  LMTCB regarding being overdue for monthly labs.   ____________________________________________________________________________________    Date: 07/23/2014  Type: Phone message  Entry By: gibbok  Received VM from pt requesting a call back regarding pneumonia vaccines. She had one in 02/08/2012, and would like to know the protocol; does she need one now? P: [phone]. To Diana Miller.   ____________________________________________________________________________________    Date: 05/03/2014  Type: Letter sent  Entry By: jowill  2016 LTFU REMINDER LETTER SENT  ____________________________________________________________________________________    Date: 06/06/2013  Type: Action  Entry By: gibbok  LTFU sent to outside provider.   ____________________________________________________________________________________    Date: 01/24/2013  Type: Letter sent  Entry By: jowill  2014 LTFU REMINDER LETTER SENT.   ____________________________________________________________________________________    Date: 11/05/2011  Type: Letter sent  Entry By: jowill  2013 LTFU REMINDER LETTER SENT.  ____________________________________________________________________________________    Date: 03/09/2011  Type: Action  Entry By: [email or web address]  Sent LTFU to provider  ____________________________________________________________________________________    Date: 12/10/2010  Type: Phone message  Entry By: jowill  Pt requesting Nov ltfu due to travelling in Sept/Oct. Scheduled as requested.  ____________________________________________________________________________________      Date: 12/01/2010  Type: Letter sent  Entry By: Kary Kos  2012 LTFU REMINDER LETTER SENT.  ____________________________________________________________________________________    Date: 05/21/2009  Type: Action  Entry By: Amie Critchley  Mailed 1/28 LTFU clinic note and immuno labs to Dr. Rex Kras and Vevelyn Royals, ARNP.  ____________________________________________________________________________________    Date: 10/30/2008  Type: Letter sent  Entry By: Kary Kos  2010 LTFU [place] 7/16.  ____________________________________________________________________________________    Date: 03/12/2008  Type: Action  Entry By: [email or web address]  Mailed 11/13 LTFU clinic note and immuno labs to Dr. Rex Kras and Renard Matter, ARNP.  ____________________________________________________________________________________    Date: 02/23/2008  Type: Medical info  Entry By: none  Patient had H1N1 vaccination in LTFU clinic.  ____________________________________________________________________________________    Date: 12/29/2007  Type: Medical info  Entry By: iliou  Dr. Rex Kras sends a clinic note wondering about rising alk phos level--he is concerned about low grade chronic rejection. He wonders about reinitating empiric  ursodiol therapy.(I think we can review these issue with patient in person when she comes for her  LTFU visit in November.)  ____________________________________________________________________________________    Date: 12/29/2007  Type: Medical info  Entry By: iliou  Dr. Rex Kras sends a clinic note wondering about rising alk phos level--he is concerned about low grade chronic rejection. He wonders about reinitating empiric  ursodiol therapy.(I think we can review these issue with patient in person when she comes for her LTFU visit in November.)  ____________________________________________________________________________________    Date: 10/24/2007  Type: Letter sent  Entry By: Kary Kos  2009 LTFU [place]  ____________________________________________________________________________________    Date: 03/07/2007  Type: Action  Entry By: [email or web address]  Mailed 11/21 LTFU clinic note to Dr. Rex Kras and Renard Matter, ARNP.  ____________________________________________________________________________________    Date: 11/16/2006  Type: Letter sent  Entry By: Kary Kos  2008 LTFU REMINDER SENT  ____________________________________________________________________________________    Date: 03/10/2006  Type: Letter sent  Entry ByEdwin Dada  Records from 11/16 LTFU clinic sent to Drs. Winfield Rast and Little.  ____________________________________________________________________________________    Date: 02/25/2006  Type: Email message  Entry By: none  Patient needs renal consult per Dr. Algis Liming.  Req to Eloise  ____________________________________________________________________________________    Date: 12/21/2005  Type: Letter sent  Entry By: Kary Kos  LTFU REMINDER SENT  ____________________________________________________________________________________    Date: 02/04/2004  Type: Chart in review  Entry By: none  Patient seen by Dr. Junious Silk in LTFU clinic 10/14. Assessment: 3 years post transplant for PBC. Her renal dysfunction is somewhat stable. Would like to keep trough FK at 5 and no lower inconjunction with continuing at 3 mg  bid in conjunction with azathioprine at 75 mg qd in hopes of preventing rejection, given her low tacrolimus level. If her creatinine clearance continues to decline, we may consider switching her tacrolimus to rapamycin.    Patient has a normocytic anemia, likely related to her renal insufficiency. She does not appear to be symptomatic at this point and, therefore, recommend following her labs and development of symptoms.      ____________________________________________________________________________________    Date: 01/29/2004  Type: Email message  Entry By: Carmela Rima Dr. Rex Kras,  Saw Maryann Conners in Transplant Clinic last week.  She looks pretty good overall. Creatinine clearance on 01-07-04 was 28 mL/min. Her tacrolimus level was 5. I probably wouldn't run her any lower than that. We did discuss the possibility of switching to rapamune/mmf (you and I may have talked about this before), but it makes me nervous because she has a history of hepatic  artery thrombosis (the same reason that the rapa liver trials were halted). Your thoughts?  Resa Miner MD  Assistant Professor of Medicine  Director, [facility] [facility]  [address] [phone];  [place]  [place]  [address]  ph: [phone]   fax: [phone]  email: [email or web address]  IF YOU ARE A PATIENT PLEASE NOTE:  Because you have chosen to communicate patient identifiable information by email, you are consenting to associated email risks. Please note email is not secure and I cannot guarantee that information transmitted will remain confidential. For more information on risks, please go to the [facility] website at [email or web address]    ____________________________________________________________________________________    Date: 01/08/2004  Type: Email message  Entry By: none  Req to Joann to schedule [place] with full flow with special attention to  hepatic artery and vein with LTFU visit on 10/14.  Per Dr. Rex Kras, local [place] questionable flows  ____________________________________________________________________________________    Date: 12/23/2003  Type: Phone message  Entry By: none  Req'da call back.  FK running high with a creatinine at 1.9  ____________________________________________________________________________________    Date: 11/15/2002  Type: Letter sent  Entry By: Kary Kos  LTFU REMINDER SENT  ____________________________________________________________________________________    Date: 08/28/2002  Type: Chart in review  Entry By: none  left message to call me back.  FK trough 9.2 with creatinine up to 1.9.  Really should reduce her FK  ____________________________________________________________________________________    Date: 07/17/2002  Type: Action  Entry By: Red Christians  Dr Rex Kras called re Santiago Glad Councilman's FK management. Pt's creatinine is increased to 1.8, and FK level has been high (13.2 06/21/02, and 10.1 on 07/10/02). Target range for [facility] with renal insufficiency  is 5-7.5. Dr Rex Kras reported that he doesn't follow FK levels for dose adjustment, and only looks at LFT's. Dr Rex Kras reports a conversation with Dr Junious Silk about running the pt "on the high side" to prevent rejection. Dr Rex Kras could not state what that meant as far as target range since he looks at the LFT's only. He reported that he wants to continue to manage Janeya's care to prevent rejection. Dr Junious Silk asked to speak with him re dose management and calcinurin sparing to prevent renal disfunction. In the future, he would like to continue to adjust Shanequia's FK doses. Will conitnue to monitor.    ____________________________________________________________________________________    Date: 07/16/2002  Type: Phone message  Entry By: dykstrac  FK level 10.1, creat 1.8 today. Pt currently taking 32m bid, (reduced from 632mbid march 11th per pt via Dr LiRex Kras FK dose increased to 73m57mrom  4mg70mb 7th for an unknown FK level by Dr LittRex Krasssage left for Dr LittEddie Dibblesice re goal FK range of 5-7.5. Will follow up with Dr LittRex KrasFK management. Pt notified of dose change. Will continue to monitor.    ____________________________________________________________________________________    Date: 07/20/2001  Type: Phone message  Entry By: fepaMarcelyn Dittylled pt. today to stop ASA.  ____________________________________________________________________________________    Date: 07/20/2001  Type: Chart in review  Entry By: fepascua  ultrasound reviewed in radiology conference. hepatic artery flows normal. per Dr. LevyHartford Polito stop ASA.

## 2019-07-07 NOTE — Historical Transplant Roadmap (Signed)
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 She has had no vascular/thrombotic, or biliary complications or prior episodes of rejection. She is currently taking tacrolimus 1mg  qam and 0.5mg  qpm and MMF 250mg  BID.

## 2019-07-07 NOTE — Historical Transplant Studies (Signed)
Ebony Woodard, Ebony Woodard ([id number])  Liver Pre-Transplant Studies  Referral Date: 11/28/2000  Status: Transplanted  ____________________________________________________________________________________    Transplant History: Liver on 12/12/2000    Primary Diagnosis: 4220 - PRIMARY BILIARY CIRRHOSIS (PBC)  PBC Icd-9: 571.6     ABO: O POS    Family History:      Education/Employment History:       History of Present Illness:     Past Medical History: Variceal bleeding           Previous Abdominal Surgery:  none    Allergies: NKDA    Medication History:     Alcohol History:     Tobacco History:      Recreational Drug History:

## 2019-07-07 NOTE — Historical Transplant Studies (Signed)
Buelah, Rennie ([id number])  Clinical Events  ____________________________________________________________________________________    Date: 06/21/2002  Area: Liver Tx Surgical  Type: Biopsy performed Elevated LFT's

## 2020-04-09 ENCOUNTER — Other Ambulatory Visit: Payer: Self-pay

## 2020-04-09 ENCOUNTER — Emergency Department (HOSPITAL_COMMUNITY): Payer: Medicare Other

## 2020-04-09 ENCOUNTER — Inpatient Hospital Stay (HOSPITAL_COMMUNITY)
Admission: EM | Admit: 2020-04-09 | Discharge: 2020-04-12 | DRG: 291 | Disposition: A | Discharge status: Home / Self Care | Payer: Medicare Other | Attending: Internal Medicine | Admitting: Internal Medicine

## 2020-04-09 DIAGNOSIS — Z20822 Contact with and (suspected) exposure to covid-19: Secondary | ICD-10-CM | POA: Diagnosis present

## 2020-04-09 DIAGNOSIS — K743 Primary biliary cirrhosis: Secondary | ICD-10-CM | POA: Diagnosis present

## 2020-04-09 DIAGNOSIS — Z888 Allergy status to other drugs, medicaments and biological substances status: Secondary | ICD-10-CM

## 2020-04-09 DIAGNOSIS — I130 Hypertensive heart and chronic kidney disease with heart failure and stage 1 through stage 4 chronic kidney disease, or unspecified chronic kidney disease: Principal | ICD-10-CM | POA: Diagnosis present

## 2020-04-09 DIAGNOSIS — Z944 Liver transplant status: Secondary | ICD-10-CM

## 2020-04-09 DIAGNOSIS — Z6830 Body mass index (BMI) 30.0-30.9, adult: Secondary | ICD-10-CM

## 2020-04-09 DIAGNOSIS — D84821 Immunodeficiency due to drugs: Secondary | ICD-10-CM | POA: Diagnosis present

## 2020-04-09 DIAGNOSIS — D61818 Other pancytopenia: Secondary | ICD-10-CM | POA: Diagnosis present

## 2020-04-09 DIAGNOSIS — C8310 Mantle cell lymphoma, unspecified site: Secondary | ICD-10-CM | POA: Diagnosis present

## 2020-04-09 DIAGNOSIS — J90 Pleural effusion, not elsewhere classified: Secondary | ICD-10-CM | POA: Diagnosis present

## 2020-04-09 DIAGNOSIS — E669 Obesity, unspecified: Secondary | ICD-10-CM | POA: Diagnosis present

## 2020-04-09 DIAGNOSIS — N189 Chronic kidney disease, unspecified: Secondary | ICD-10-CM | POA: Diagnosis present

## 2020-04-09 DIAGNOSIS — I509 Heart failure, unspecified: Secondary | ICD-10-CM

## 2020-04-09 DIAGNOSIS — R0602 Shortness of breath: Secondary | ICD-10-CM

## 2020-04-09 DIAGNOSIS — I5033 Acute on chronic diastolic (congestive) heart failure: Secondary | ICD-10-CM | POA: Diagnosis not present

## 2020-04-09 DIAGNOSIS — N179 Acute kidney failure, unspecified: Secondary | ICD-10-CM | POA: Diagnosis present

## 2020-04-09 DIAGNOSIS — R778 Other specified abnormalities of plasma proteins: Secondary | ICD-10-CM | POA: Diagnosis present

## 2020-04-09 DIAGNOSIS — I2721 Secondary pulmonary arterial hypertension: Secondary | ICD-10-CM | POA: Diagnosis present

## 2020-04-09 DIAGNOSIS — E785 Hyperlipidemia, unspecified: Secondary | ICD-10-CM | POA: Diagnosis present

## 2020-04-09 DIAGNOSIS — Z79899 Other long term (current) drug therapy: Secondary | ICD-10-CM

## 2020-04-09 DIAGNOSIS — N1832 Chronic kidney disease, stage 3b: Secondary | ICD-10-CM | POA: Diagnosis present

## 2020-04-09 HISTORY — DX: Other long term (current) drug therapy: Z79.899

## 2020-04-09 HISTORY — DX: Immunodeficiency due to drugs: D84.821

## 2020-04-09 HISTORY — DX: Mantle cell lymphoma, unspecified site: C83.10

## 2020-04-09 HISTORY — DX: Primary biliary cirrhosis: K74.3

## 2020-04-09 HISTORY — DX: Essential (primary) hypertension: I10

## 2020-04-09 LAB — BASIC METABOLIC PANEL
Anion gap: 13 (ref 5–15)
BUN: 86 mg/dL — ABNORMAL HIGH (ref 8–23)
CO2: 20 mmol/L — ABNORMAL LOW (ref 22–32)
Calcium: 9.6 mg/dL (ref 8.9–10.3)
Chloride: 102 mmol/L (ref 98–111)
Creatinine, Ser: 1.71 mg/dL — ABNORMAL HIGH (ref 0.44–1.00)
GFR, Estimated: 30 mL/min — ABNORMAL LOW (ref >60)
Glucose, Bld: 113 mg/dL — ABNORMAL HIGH (ref 70–99)
Potassium: 3.9 mmol/L (ref 3.5–5.1)
Sodium: 135 mmol/L (ref 135–145)

## 2020-04-09 LAB — TROPONIN I (HIGH SENSITIVITY)
Troponin I (High Sensitivity): 43 ng/L — ABNORMAL HIGH (ref <18)
Troponin I (High Sensitivity): 42 ng/L — ABNORMAL HIGH (ref <18)

## 2020-04-09 LAB — CBC
HCT: 22.9 % — ABNORMAL LOW (ref 36.0–46.0)
Hemoglobin: 7.1 g/dL — ABNORMAL LOW (ref 12.0–15.0)
MCH: 30 pg (ref 26.0–34.0)
MCHC: 31 g/dL (ref 30.0–36.0)
MCV: 96.6 fL (ref 80.0–100.0)
Platelets: 94 10*3/uL — ABNORMAL LOW (ref 150–400)
RBC: 2.37 MIL/uL — ABNORMAL LOW (ref 3.87–5.11)
RDW: 17.8 % — ABNORMAL HIGH (ref 11.5–15.5)
WBC: 3.5 10*3/uL — ABNORMAL LOW (ref 4.0–10.5)
nRBC: 0 % (ref 0.0–0.2)

## 2020-04-09 NOTE — ED Triage Notes (Signed)
Pt arrives to ED with c/o of shortness of breath that started yesterday. The SOB started gradually yesterday and has not gotten better. Pt states he chest/lungs feels tight. Pt states she experienced orthopnea and DOE, which is new. Pt is from [facility] and flew to [place] on 12/19. Pt NAD.

## 2020-04-10 ENCOUNTER — Observation Stay (HOSPITAL_BASED_OUTPATIENT_CLINIC_OR_DEPARTMENT_OTHER): Payer: Medicare Other

## 2020-04-10 ENCOUNTER — Encounter (HOSPITAL_COMMUNITY): Payer: Self-pay | Admitting: Internal Medicine

## 2020-04-10 ENCOUNTER — Emergency Department (HOSPITAL_COMMUNITY): Payer: Medicare Other

## 2020-04-10 DIAGNOSIS — R778 Other specified abnormalities of plasma proteins: Secondary | ICD-10-CM | POA: Diagnosis not present

## 2020-04-10 DIAGNOSIS — I5021 Acute systolic (congestive) heart failure: Secondary | ICD-10-CM | POA: Diagnosis not present

## 2020-04-10 DIAGNOSIS — R609 Edema, unspecified: Secondary | ICD-10-CM

## 2020-04-10 DIAGNOSIS — I5033 Acute on chronic diastolic (congestive) heart failure: Secondary | ICD-10-CM | POA: Diagnosis present

## 2020-04-10 DIAGNOSIS — Z79899 Other long term (current) drug therapy: Secondary | ICD-10-CM

## 2020-04-10 DIAGNOSIS — D84821 Immunodeficiency due to drugs: Secondary | ICD-10-CM | POA: Diagnosis not present

## 2020-04-10 DIAGNOSIS — N179 Acute kidney failure, unspecified: Secondary | ICD-10-CM

## 2020-04-10 DIAGNOSIS — D61818 Other pancytopenia: Secondary | ICD-10-CM

## 2020-04-10 DIAGNOSIS — M7989 Other specified soft tissue disorders: Secondary | ICD-10-CM

## 2020-04-10 DIAGNOSIS — C8310 Mantle cell lymphoma, unspecified site: Secondary | ICD-10-CM

## 2020-04-10 DIAGNOSIS — K743 Primary biliary cirrhosis: Secondary | ICD-10-CM

## 2020-04-10 DIAGNOSIS — N189 Chronic kidney disease, unspecified: Secondary | ICD-10-CM

## 2020-04-10 LAB — PREPARE RBC (CROSSMATCH)

## 2020-04-10 LAB — HEMOGLOBIN AND HEMATOCRIT, BLOOD
HCT: 19.8 % — ABNORMAL LOW (ref 36.0–46.0)
Hemoglobin: 6.2 g/dL — CL (ref 12.0–15.0)

## 2020-04-10 LAB — BRAIN NATRIURETIC PEPTIDE: B Natriuretic Peptide: 470.7 pg/mL — ABNORMAL HIGH (ref 0.0–100.0)

## 2020-04-10 LAB — D-DIMER, QUANTITATIVE: D-Dimer, Quant: 1.26 ug/mL-FEU — ABNORMAL HIGH (ref 0.00–0.50)

## 2020-04-10 LAB — ECHOCARDIOGRAM COMPLETE
Area-P 1/2: 3.27 cm2
Height: 66 in
S' Lateral: 2.5 cm
Weight: 2560 oz

## 2020-04-10 LAB — SARS CORONAVIRUS 2 (TAT 6-24 HRS): SARS Coronavirus 2: NEGATIVE

## 2020-04-10 LAB — TSH: TSH: 6.764 u[IU]/mL — ABNORMAL HIGH (ref 0.350–4.500)

## 2020-04-10 LAB — ABO/RH: ABO/RH(D): O POS

## 2020-04-10 LAB — MRSA PCR SCREENING: MRSA by PCR: NEGATIVE

## 2020-04-10 MED ORDER — SODIUM CHLORIDE 0.9% IV SOLUTION: Freq: Once | INTRAVENOUS | Status: AC

## 2020-04-10 MED ORDER — ADULT MULTIVITAMIN W/MINERALS CH
1.0000 | ORAL_TABLET | Freq: Every day | ORAL | Status: DC
  Administered 2020-04-10 – 2020-04-12 (×3): 1 via ORAL
  Filled 2020-04-10 (×4): qty 1

## 2020-04-10 MED ORDER — ATORVASTATIN CALCIUM 10 MG PO TABS
10.0000 mg | ORAL_TABLET | Freq: Every day | ORAL | Status: DC
  Administered 2020-04-10 – 2020-04-11 (×2): 10 mg via ORAL
  Filled 2020-04-10 (×2): qty 1

## 2020-04-10 MED ORDER — URSODIOL 300 MG PO CAPS
600.0000 mg | ORAL_CAPSULE | Freq: Two times a day (BID) | ORAL | Status: DC
  Administered 2020-04-10 – 2020-04-12 (×5): 600 mg via ORAL
  Filled 2020-04-10 (×6): qty 2

## 2020-04-10 MED ORDER — SODIUM CHLORIDE 0.9% FLUSH: 3.0000 mL | INTRAVENOUS | Status: DC | PRN

## 2020-04-10 MED ORDER — FUROSEMIDE 10 MG/ML IJ SOLN
40.0000 mg | Freq: Two times a day (BID) | INTRAMUSCULAR | Status: DC
  Administered 2020-04-10 – 2020-04-12 (×4): 40 mg via INTRAVENOUS
  Filled 2020-04-10 (×3): qty 4

## 2020-04-10 MED ORDER — ACETAMINOPHEN 325 MG PO TABS
650.0000 mg | ORAL_TABLET | ORAL | Status: DC | PRN
  Administered 2020-04-10 – 2020-04-12 (×5): 650 mg via ORAL
  Filled 2020-04-10 (×5): qty 2

## 2020-04-10 MED ORDER — HEPARIN SODIUM (PORCINE) 5000 UNIT/ML IJ SOLN
5000.0000 [IU] | Freq: Three times a day (TID) | INTRAMUSCULAR | Status: DC
  Administered 2020-04-10: 17:00:00 5000 [IU] via SUBCUTANEOUS
  Filled 2020-04-10: qty 1

## 2020-04-10 MED ORDER — TACROLIMUS 1 MG PO CAPS
1.0000 mg | ORAL_CAPSULE | Freq: Every morning | ORAL | Status: DC
  Administered 2020-04-11 – 2020-04-12 (×2): 1 mg via ORAL
  Filled 2020-04-10 (×2): qty 1

## 2020-04-10 MED ORDER — PSYLLIUM 95 % PO PACK
1.0000 | PACK | Freq: Every day | ORAL | Status: DC
  Administered 2020-04-11: 1 via ORAL
  Filled 2020-04-10 (×3): qty 1

## 2020-04-10 MED ORDER — ONDANSETRON HCL 4 MG/2ML IJ SOLN: 4.0000 mg | Freq: Four times a day (QID) | INTRAMUSCULAR | Status: DC | PRN

## 2020-04-10 MED ORDER — TACROLIMUS 0.5 MG PO CAPS
0.5000 mg | ORAL_CAPSULE | Freq: Every evening | ORAL | Status: DC
  Administered 2020-04-10 – 2020-04-12 (×3): 0.5 mg via ORAL
  Filled 2020-04-10 (×3): qty 1

## 2020-04-10 MED ORDER — SODIUM CHLORIDE 0.9 % IV SOLN: 250.0000 mL | INTRAVENOUS | Status: DC | PRN

## 2020-04-10 MED ORDER — SODIUM CHLORIDE 0.9% FLUSH
3.0000 mL | Freq: Two times a day (BID) | INTRAVENOUS | Status: DC
  Administered 2020-04-10 – 2020-04-12 (×3): 3 mL via INTRAVENOUS

## 2020-04-10 MED ORDER — MYCOPHENOLATE MOFETIL 250 MG PO CAPS
250.0000 mg | ORAL_CAPSULE | Freq: Two times a day (BID) | ORAL | Status: DC
  Administered 2020-04-10 – 2020-04-12 (×5): 250 mg via ORAL
  Filled 2020-04-10 (×6): qty 1

## 2020-04-10 MED ORDER — FUROSEMIDE 10 MG/ML IJ SOLN
40.0000 mg | Freq: Once | INTRAMUSCULAR | Status: DC
  Filled 2020-04-10: qty 4

## 2020-04-10 NOTE — Progress Notes (Signed)
Noted to be in tears when talking to someone  on the phone.

## 2020-04-10 NOTE — ED Notes (Signed)
RN unable to take report at this time 

## 2020-04-10 NOTE — Progress Notes (Signed)
Md aware about the temp- consistent  with 94 Degree F orally despite warm blanket applied. Temp- checked with 3 different  Thermometer.  And also checked axilla with same result.

## 2020-04-10 NOTE — Progress Notes (Signed)
Warming blanket ordered,awaiting availability.

## 2020-04-10 NOTE — Progress Notes (Signed)
Ambulated to the bath room with cane . Moved her bowel  brown in color. in mod amount.

## 2020-04-10 NOTE — H&P (Signed)
History and Physical    Kelly Jimenez [id number] DOB: 02-Jun-1940 DOA: 04/09/2020  Referring MD/NP/PA: Gean Birchwood, MD PCP: Patient, No Pcp Per  Patient coming from: home   Chief Complaint: Shortness of breath  I have personally briefly reviewed patient's old medical records in [place]   HPI: Kelly Jimenez is a 79 y.o. 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  Patient is visiting from [place] to spend holidays with her family.  The plane ride lasted approximately 5 or 6 hours for which the patient states that she did not get up during the flight, but wore compression stockings. Denies having any calf pain, chest pain, nausea, vomiting, or diarrhea.  She reports that she had been diagnosed with mantle cell lymphoma in June of this year and had initially been on Rituxan, but was switched over to Revlimid.  Last dose of Revlimid was over a month ago where she reports that it caused lower extremity swelling, shortness of breath, and had to break out in a diffuse rash.  It appears she followed up with cardiology and oncology in regards to her symptoms.  She recently had CTA of the chest that showed no acute abnormalities and echocardiogram from 03/05/2020 had revealed EF of 76% with grade 3 diastolic dysfunction, moderate mitral regurgitation, pulmonary artery hypertension with RV SP of 47 mmHg. review of records notes that cardiology had planned for her to continue Lasix 40 mg daily and start her on spironolactone 12.5 mg daily once returning from [place].  She denies having any bloody/dark stools, but intermittently has nosebleeds.  Previously required a  blood transfusion last year when she fell and broke her leg following surgery.  She provides her  oncologist number [phone].   ED Course: Upon admission into the emergency department patient was seen to be afebrile, respirations 16-21, blood pressures maintained, and O2 saturations currently maintained on room air.  Labs significant for WBC 3.5, hemoglobin 7.1, platelets 94, BUN 86, and creatinine 1.71.  Chest x-ray showed small left pleural effusion with calcified granuloma of the left lower lobe.  CT scan of the chest w/o contrast significant for bilateral pleural effusions.  Review of Systems  Constitutional: Positive for malaise/fatigue. Negative for fever.  HENT: Positive for nosebleeds. Negative for congestion.   Eyes: Negative for photophobia and pain.  Respiratory: Positive for shortness of breath. Negative for cough.   Cardiovascular: Positive for leg swelling. Negative for chest pain.  Gastrointestinal: Negative for abdominal pain, diarrhea, nausea and vomiting.  Genitourinary: Negative for dysuria and hematuria.  Musculoskeletal: Positive for back pain. Negative for falls.  Skin: Negative for rash.  Neurological: Negative for focal weakness and loss of consciousness.  Endo/Heme/Allergies: Bruises/bleeds easily.  Psychiatric/Behavioral: Negative for substance abuse.    Past Medical History:  Diagnosis Date  . Hypertension   . Immunosuppression due to drug therapy ([place])   . Mantle cell lymphoma ([place])   . Primary biliary cirrhosis [facility])     Past Surgical History:  Procedure Laterality Date  . LIVER TRANSPLANT       has no history on file for tobacco use, alcohol use, and drug use.  Allergies  Allergen Reactions  . Statins Other (See Comments)    Pt unsure    History reviewed.  No pertinent family history.  Current Outpatient Medications  Medication Sig Dispense Refill  . acetaminophen (TYLENOL) 500 mg tablet Take 1,000 mg by mouth 3 times daily . 0  .  allopurinol (ZYLOPRIM) 300 mg tablet Take 1 tablet by mouth Daily. 30 tablet 2  . amLODIPine (NORVASC) 5 mg tablet TAKE ONE TABLET BY MOUTH ONCE DAILY 90 tablet 1  . atorvaSTATin (LIPITOR) 10 mg tablet Take 1 tablet by mouth nightly. 90 tablet 1  . CALCIUM PO Take 1,000 mg by mouth Takes 1000mg  in the AM and 500mg  at night. .  . COENZYME Q10 PO Take 1 tablet by mouth Daily.  . furosemide (LASIX) 40 mg tablet Take 1 tablet by mouth Daily. 30 tablet 3  . lenalidomide (REVLIMID) 15 mg capsule TAKE 1 CAPSULE (15 MG TOTAL) DAILY ON DAYS 1 THROUGH 21 OF EACH 28 DAY CYCLE (3 WEEKS ON, 1 WEEK OFF) 02/20/20 (Patient not taking: Reported on 03/03/2020) 21 capsule 0  . Multiple Vitamins-Minerals (ADULT MULTIVITAMIN WITH MINERALS/IRON) TABS Take 1 tablet by mouth Daily.  . mycophenolate (CELLCEPT) 250 mg capsule Take 250 mg by mouth 2 times daily.  . potassium chloride 20 mEq CR tablet Take 1 tablet by mouth 2 times daily. 60 tablet 3  . spironolactone (ALDACTONE) 25 mg tablet Take 0.5 tablets by mouth Daily. 45 tablet 3  . tacrolimus (PROGRAF) 0.5 mg capsule Take 0.5 mg by mouth every evening.  . tacrolimus (PROGRAF) 1 mg capsule Take 1 mg by mouth every morning.  . triamcinolone (KENALOG) 0.1% cream Apply thin film to affected area(s) two to four times daily as needed: avoid face and groin areas. 28.4 g 0  . URSODIOL PO Take 500 mg by mouth twice a day.     Physical Exam:  Constitutional: Elderly female who appears to be in no acute distress at this time. Vitals:   04/10/20 0859 04/10/20 0930 04/10/20 1024 04/10/20 1138  BP:  (!) 109/53    Pulse:  79    Resp:  18    Temp:  98.4 F (36.9 C) (!) 97.2 F (36.2 C)   TempSrc:  Oral Oral   SpO2: 100% 98%    Weight:    (!) 186 kg  Height:    5\' 6"  (1.676 m)   Eyes: PERRL, lids and conjunctivae normal ENMT: Mucous membranes are moist. Posterior pharynx clear of any exudate or lesions.Normal dentition.  Neck: normal, supple, no masses,  no thyromegaly Respiratory: clear to auscultation bilaterally, no wheezing, no crackles. Normal respiratory effort. No accessory muscle use.  Cardiovascular: Regular rate and rhythm, no murmurs / rubs / gallops. No extremity edema. 2+ pedal pulses. No carotid bruits.  Abdomen: no tenderness, no masses palpated. No hepatosplenomegaly. Bowel sounds positive.  Musculoskeletal: no clubbing / cyanosis. No joint deformity upper and lower extremities. Good ROM, no contractures. Normal muscle tone.  Skin: no rashes, lesions, ulcers. No induration Neurologic: CN 2-12 grossly intact. Sensation intact, DTR normal. Strength 5/5 in all 4.  Psychiatric: Normal judgment and insight. Alert and oriented x 3. Normal mood.     Labs on Admission: I have personally reviewed following labs and imaging studies  CBC: Recent Labs  Lab 04/09/20 1306  WBC 3.5*  HGB 7.1*  HCT 22.9*  MCV 96.6  PLT 94*   Basic Metabolic Panel: Recent Labs  Lab 04/09/20 1306  NA 135  K 3.9  CL 102  CO2 20*  GLUCOSE 113*  BUN 86*  CREATININE 1.71*  CALCIUM  9.6   GFR: Estimated Creatinine Clearance: 47.1 mL/min (A) (by C-G formula based on SCr of 1.71 mg/dL (H)). Liver Function Tests: No results for input(s): AST, ALT, ALKPHOS, BILITOT, PROT, ALBUMIN in the last 168 hours. No results for input(s): LIPASE, AMYLASE in the last 168 hours. No results for input(s): AMMONIA in the last 168 hours. Coagulation Profile: No results for input(s): INR, PROTIME in the last 168 hours. Cardiac Enzymes: No results for input(s): CKTOTAL, CKMB, CKMBINDEX, TROPONINI in the last 168 hours. BNP (last 3 results) No results for input(s): PROBNP in the last 8760 hours. HbA1C: No results for input(s): HGBA1C in the last 72 hours. CBG: No results for input(s): GLUCAP in the last 168 hours. Lipid Profile: No results for input(s): CHOL, HDL, LDLCALC, TRIG, CHOLHDL, LDLDIRECT in the last 72 hours. Thyroid Function Tests: No results for  input(s): TSH, T4TOTAL, FREET4, T3FREE, THYROIDAB in the last 72 hours. Anemia Panel: No results for input(s): VITAMINB12, FOLATE, FERRITIN, TIBC, IRON, RETICCTPCT in the last 72 hours. Urine analysis: No results found for: COLORURINE, APPEARANCEUR, LABSPEC, PHURINE, GLUCOSEU, HGBUR, BILIRUBINUR, KETONESUR, PROTEINUR, UROBILINOGEN, NITRITE, LEUKOCYTESUR Sepsis Labs: Recent Results (from the past 240 hour(s))  SARS CORONAVIRUS 2 (TAT 6-24 HRS) Nasopharyngeal Nasopharyngeal Swab     Status: None   Collection Time: 04/10/20  2:50 AM   Specimen: Nasopharyngeal Swab  Result Value Ref Range Status   SARS Coronavirus 2 NEGATIVE NEGATIVE Final    Comment: (NOTE) SARS-CoV-2 target nucleic acids are NOT DETECTED.  The SARS-CoV-2 RNA is generally detectable in upper and lower respiratory specimens during the acute phase of infection. Negative results do not preclude SARS-CoV-2 infection, do not rule out co-infections with other pathogens, and should not be used as the sole basis for treatment or other patient management decisions. Negative results must be combined with clinical observations, patient history, and epidemiological information. The expected result is Negative.  Fact Sheet for Patients: SugarRoll.be  Fact Sheet for Healthcare Providers: https://www.woods-mathews.com/  This test is not yet approved or cleared by the [place] FDA and  has been authorized for detection and/or diagnosis of SARS-CoV-2 by FDA under an Emergency Use Authorization (EUA). This EUA will remain  in effect (meaning this test can be used) for the duration of the COVID-19 declaration under Se ction 564(b)(1) of the Act, 21 U.S.C. section 360bbb-3(b)(1), unless the authorization is terminated or revoked sooner.  Performed at [facility], [place] [address], [place], [place] [address]   MRSA PCR Screening     Status: None   Collection Time: 04/10/20 10:26 AM    Specimen: Nasopharyngeal  Result Value Ref Range Status   MRSA by PCR NEGATIVE NEGATIVE Final    Comment:        The GeneXpert MRSA Assay (FDA approved for NASAL specimens only), is one component of a comprehensive MRSA colonization surveillance program. It is not intended to diagnose MRSA infection nor to guide or monitor treatment for MRSA infections. Performed at [facility], [place] [address], [place], [place] [address]      Radiological Exams on Admission: DG Chest 2 View  Result Date: 04/09/2020 CLINICAL DATA:  Shortness of breath and congestion EXAM: CHEST - 2 VIEW COMPARISON:  None. FINDINGS: There is a small left pleural effusion. There is a calcified granuloma in the left lower lobe. There is no edema or airspace opacity. Heart is upper normal in size with pulmonary vascularity normal. No adenopathy. No bone lesions. IMPRESSION: Small left pleural effusion.  Calcified granuloma left lower lobe. No edema or airspace opacity. Heart upper normal in size. Electronically Signed   By: Lowella Grip III M.D.   On: 04/09/2020 13:46   CT Chest Wo Contrast  Result Date: 04/10/2020 CLINICAL DATA:  Chest pain and shortness of breath EXAM: CT CHEST WITHOUT CONTRAST TECHNIQUE: Multidetector CT imaging of the chest was performed following the standard protocol without IV contrast. COMPARISON:  None. FINDINGS: Cardiovascular: Scattered aortic atherosclerosis is seen. Coronary artery calcifications are noted. The heart size is normal. There is no pericardial thickening or effusion. Mediastinum/Nodes: There are no enlarged mediastinal, hilar or axillary lymph nodes, however limited due to lack of intravenous contrast. The thyroid gland, trachea and esophagus demonstrate no significant findings. Lungs/Pleura: Small bilateral pleural effusions are seen, right greater than left. Minimal patchy airspace opacity seen at both lung bases. Mildly increased interlobular septal thickening is noted.  There is a patchy airspace opacity seen at the periphery of the left lung base and at the right middle lobe and right lung base. Upper abdomen: The visualized portion of the upper abdomen is unremarkable. Musculoskeletal/Chest wall: There is no chest wall mass or suspicious osseous finding. No acute osseous abnormality IMPRESSION: Small bilateral pleural effusions, right greater than left. Mild bilateral patchy airspace opacities most notable in the right middle lobe and right lung base which are nonspecific could be due to atelectasis and/or infectious etiology. Findings which could be suggestive of interstitial edema. Electronically Signed   By: Prudencio Pair M.D.   On: 04/10/2020 01:28    EKG: Independently reviewed. Sinus rhythm at 77 bpm  Assessment/Plan Acute on chronic diastolic congestive heart failure: Patient presents with complaints of shortness of breath.  CT imaging revealed small bilateral pleural effusions right greater than left with mild bilateral patchy airspace opacities that could be suggestive of interstitial edema versus atelectasis/infection.  BNP was elevated at 470.7.  She has been taking Lasix 40 mg p.o. daily.  Her echocardiogram from 02/2020 revealed EF of 76% with grade 3 diastolic dysfunction, moderate mitral regurgitation, pulmonary artery hypertension with RV SP of 47 mmHg. Records with the patient's cardiologist seems to show that it was recommended that she be started on spironolactone 12.5 mg once returning home. -Admit to a telemetry bed -Heart failure orders set  initiated  -Continuous pulse oximetry with nasal cannula oxygen as needed to keep O2 saturations >92% -Strict I&Os and daily weights -Elevate lower extremities -Check TSH -Lasix 40 mg IV twice daily -Reassess in a.m. and adjust diuresis as needed. -Check echocardiogram -May want to get in contact with patient's cardiologist in a.m. as it appears patient was supposed to start on spironolactone 12.5  mg  Elevated troponin: Acute.  High-sensitivity troponin 43->42.  Troponins not seen to be acutely rising.  Patient denies any reports of chest pain. -Continue to monitor  Shortness of breath: Patient symptoms could likely be multifactorial in the setting of CHF/anemia/pulmonary artery hypertension, but given patient's recent flight traveling from [facility] would like to rule out the possibility of a DVT. -Check D-dimer -Check bilateral lower extremity Doppler ultrasound -May warrant VQ scan scan if D-dimer significantly elevate  Pancytopenia: Acute on chronic.  Hemoglobin dropped down to 7.1, but previously had about around 8.4 per review of records from [place] from 12/17.  Patient denies any black or dark stools.  WBC 3.5 and platelet count 94 which appears similar to previous. Question if this is related with -Check stool guaiac -check LDH -Transfuse 2 unit of  packed red blood cells as repeat hemoglobin 6.6  -goal hemoglobin 8 -Continue to monitor  Acute kidney injury superimposed on chronic kidney disease stage IIIb: Patient presents with creatinine elevated at 1.71 with BUN 86.  Baseline creatinine have previously been around 1.2-1.4 -Will continue to monitor with IV diuresis  Mantle cell lymphoma: Patient reports that she had been receiving treatment with Rituxan and subsequently Revlimid up into the last month when treatment had to be stopped due to reaction. -May consider notifying patient otologist in a.m. for any additional recommendations  History of liver transplant on chronic immunosuppressive therapy: Home medications include CellCept 250 mg twice daily, tacrolimus 1 mg in a.m., and 0.5 mg at night. -Continue home regimen  Primary biliary cirrhosis: Home medications include ursodiol 500 mg twice daily. -Continue ursodiol  Essential hypertension: Currently home blood pressure medications include amlodipine 5 mg daily and furosemide 40 mg daily. -Held  lisinopril  DVT prophylaxis: SCDs Code Status: Full Family Communication: Updated husband Disposition Plan: Hopefully discharge home   Consults called: None Admission status: Observation,  Norval Morton MD [facility]   If 7PM-7AM, please contact night-coverage   04/10/2020, 12:45 PM

## 2020-04-10 NOTE — Progress Notes (Signed)
PT Cancellation Note  Patient Details Name: Maryagnes Carrasco MRN: [id number] DOB: 03-26-1941   Cancelled Treatment:    Reason Eval/Treat Not Completed: Active bedrest order. PT will follow up when the pt is off bedrest and appropriate to mobilize.   Arlyss Gandy 04/10/2020, 1:45 PM

## 2020-04-10 NOTE — ED Notes (Signed)
Got patient some orange juice patient is resting with call bell in reach

## 2020-04-10 NOTE — Progress Notes (Signed)
Admitted  From the ED by wheelchair  Awake and alert.

## 2020-04-10 NOTE — Progress Notes (Signed)
  Echocardiogram 2D Echocardiogram has been performed.  Augustine Radar 04/10/2020, 11:31 AM

## 2020-04-10 NOTE — ED Notes (Signed)
Assisted patient to bathroom. Pt is ambulatory with steady with use of personal cane.

## 2020-04-10 NOTE — ED Provider Notes (Signed)
[facility] EMERGENCY DEPARTMENT Provider Note   CSN: [id number] Arrival date & time: 04/09/20  1237     History Chief Complaint  Patient presents with  . Shortness of Breath    Kelly Jimenez is a 79 y.o. female.  Patient presents to the emergency department with a chief complaint of shortness of breath.  She states that the symptoms started yesterday.  She reports that the symptoms have been gradually worsening.  She reports the her chest feels tight.  She reports associated orthopnea and dyspnea on exertion.  She states that this is new for her.  She states that she recently flew in from [place] to [place].  She denies any fever, chills, cough.  She has history of liver transplant after a bad time is currently on being treated for mantle cell lymphoma with Rituxan.  The history is provided by the patient. No language interpreter was used.       No past medical history on file.  There are no problems to display for this patient.     OB History   No obstetric history on file.     No family history on file.     Home Medications Prior to Admission medications   Not on File    Allergies    Statins  Review of Systems   Review of Systems  All other systems reviewed and are negative.   Physical Exam Updated Vital Signs BP (!) 119/50 (BP Location: Left Arm)   Pulse 75   Temp 98 F (36.7 C) (Oral)   Resp 18   Ht 5\' 6"  (1.676 m)   Wt 72.6 kg   SpO2 98%   BMI 25.82 kg/m   Physical Exam Vitals and nursing note reviewed.  Constitutional:      General: She is not in acute distress.    Appearance: She is well-developed and well-nourished.  HENT:     Head: Normocephalic and atraumatic.  Eyes:     Conjunctiva/sclera: Conjunctivae normal.  Cardiovascular:     Rate and Rhythm: Normal rate and regular rhythm.     Heart sounds: No murmur heard.   Pulmonary:     Effort: Pulmonary effort is normal. No respiratory distress.      Breath sounds: Normal breath sounds.  Abdominal:     Palpations: Abdomen is soft.     Tenderness: There is no abdominal tenderness.  Musculoskeletal:        General: No edema. Normal range of motion.     Cervical back: Neck supple.  Skin:    General: Skin is warm and dry.  Neurological:     Mental Status: She is alert and oriented to person, place, and time.  Psychiatric:        Mood and Affect: Mood and affect and mood normal.        Behavior: Behavior normal.     ED Results / Procedures / Treatments   Labs (all labs ordered are listed, but only abnormal results are displayed) Labs Reviewed  BASIC METABOLIC PANEL - Abnormal; Notable for the following components:      Result Value   CO2 20 (*)    Glucose, Bld 113 (*)    BUN 86 (*)    Creatinine, Ser 1.71 (*)    GFR, Estimated 30 (*)    All other components within normal limits  CBC - Abnormal; Notable for the following components:   WBC 3.5 (*)    RBC 2.37 (*)  Hemoglobin 7.1 (*)    HCT 22.9 (*)    RDW 17.8 (*)    Platelets 94 (*)    All other components within normal limits  TROPONIN I (HIGH SENSITIVITY) - Abnormal; Notable for the following components:   Troponin I (High Sensitivity) 43 (*)    All other components within normal limits  TROPONIN I (HIGH SENSITIVITY) - Abnormal; Notable for the following components:   Troponin I (High Sensitivity) 42 (*)    All other components within normal limits    EKG None  Radiology DG Chest 2 View  Result Date: 04/09/2020 CLINICAL DATA:  Shortness of breath and congestion EXAM: CHEST - 2 VIEW COMPARISON:  None. FINDINGS: There is a small left pleural effusion. There is a calcified granuloma in the left lower lobe. There is no edema or airspace opacity. Heart is upper normal in size with pulmonary vascularity normal. No adenopathy. No bone lesions. IMPRESSION: Small left pleural effusion. Calcified granuloma left lower lobe. No edema or airspace opacity. Heart upper normal  in size. Electronically Signed   By: Bretta Bang III M.D.   On: 04/09/2020 13:46    Procedures Procedures (including critical care time)  Medications Ordered in ED Medications - No data to display  ED Course  I have reviewed the triage vital signs and the nursing notes.  Pertinent labs & imaging results that were available during my care of the patient were reviewed by me and considered in my medical decision making (see chart for details).    MDM Rules/Calculators/A&P                          This patient complains of SOB, this involves an extensive number of treatment options, and is a complaint that carries with it a high risk of complications and morbidity.    Differential Dx ACS, CHF, PE, COVID  Pertinent Labs I ordered, reviewed, and interpreted labs, which included CBC, BMP, BNP, and trop notable for flat, but elevated trops at 43->42, BNP 470, Cr 1.7.  Imaging Interpretation I ordered imaging studies which included CXR.  I independently visualized and interpreted the CXR, which showed left sided pleural effusion.  CT shows small bilateral pleural effusions.   Reassessments After the interventions stated above, I reevaluated the patient and found without oxygen requirement.  Patient seen by and discussed with Dr. Blinda Leatherwood, who recommends admission for heart failure.  Consultants Dr. Toniann Fail - who is appreciated for observing patient overnight.  Plan Admit - Obs    Final Clinical Impression(s) / ED Diagnoses Final diagnoses:  Acute on chronic congestive heart failure, unspecified heart failure type [facility])    Rx / DC Orders ED Discharge Orders    None       Roxy Horseman, PA-C 04/10/20 0302    Gilda Crease, MD 04/10/20 [phone]

## 2020-04-11 DIAGNOSIS — E785 Hyperlipidemia, unspecified: Secondary | ICD-10-CM | POA: Diagnosis present

## 2020-04-11 DIAGNOSIS — Z944 Liver transplant status: Secondary | ICD-10-CM | POA: Diagnosis not present

## 2020-04-11 DIAGNOSIS — I5033 Acute on chronic diastolic (congestive) heart failure: Secondary | ICD-10-CM

## 2020-04-11 DIAGNOSIS — D84821 Immunodeficiency due to drugs: Secondary | ICD-10-CM | POA: Diagnosis present

## 2020-04-11 DIAGNOSIS — Z6830 Body mass index (BMI) 30.0-30.9, adult: Secondary | ICD-10-CM | POA: Diagnosis not present

## 2020-04-11 DIAGNOSIS — J90 Pleural effusion, not elsewhere classified: Secondary | ICD-10-CM | POA: Diagnosis present

## 2020-04-11 DIAGNOSIS — K743 Primary biliary cirrhosis: Secondary | ICD-10-CM | POA: Diagnosis present

## 2020-04-11 DIAGNOSIS — Z79899 Other long term (current) drug therapy: Secondary | ICD-10-CM | POA: Diagnosis not present

## 2020-04-11 DIAGNOSIS — C8310 Mantle cell lymphoma, unspecified site: Secondary | ICD-10-CM | POA: Diagnosis present

## 2020-04-11 DIAGNOSIS — Z888 Allergy status to other drugs, medicaments and biological substances status: Secondary | ICD-10-CM | POA: Diagnosis not present

## 2020-04-11 DIAGNOSIS — I2721 Secondary pulmonary arterial hypertension: Secondary | ICD-10-CM | POA: Diagnosis present

## 2020-04-11 DIAGNOSIS — N179 Acute kidney failure, unspecified: Secondary | ICD-10-CM | POA: Diagnosis present

## 2020-04-11 DIAGNOSIS — N1832 Chronic kidney disease, stage 3b: Secondary | ICD-10-CM | POA: Diagnosis present

## 2020-04-11 DIAGNOSIS — E669 Obesity, unspecified: Secondary | ICD-10-CM | POA: Diagnosis present

## 2020-04-11 DIAGNOSIS — Z20822 Contact with and (suspected) exposure to covid-19: Secondary | ICD-10-CM | POA: Diagnosis present

## 2020-04-11 DIAGNOSIS — I130 Hypertensive heart and chronic kidney disease with heart failure and stage 1 through stage 4 chronic kidney disease, or unspecified chronic kidney disease: Secondary | ICD-10-CM | POA: Diagnosis present

## 2020-04-11 DIAGNOSIS — D61818 Other pancytopenia: Secondary | ICD-10-CM | POA: Diagnosis present

## 2020-04-11 LAB — CBC WITH DIFFERENTIAL/PLATELET
Abs Immature Granulocytes: 0.01 10*3/uL (ref 0.00–0.07)
Basophils Absolute: 0 10*3/uL (ref 0.0–0.1)
Basophils Relative: 0 %
Eosinophils Absolute: 0.2 10*3/uL (ref 0.0–0.5)
Eosinophils Relative: 8 %
HCT: 25.2 % — ABNORMAL LOW (ref 36.0–46.0)
Hemoglobin: 8.2 g/dL — ABNORMAL LOW (ref 12.0–15.0)
Immature Granulocytes: 0 %
Lymphocytes Relative: 19 %
Lymphs Abs: 0.6 10*3/uL — ABNORMAL LOW (ref 0.7–4.0)
MCH: 29.4 pg (ref 26.0–34.0)
MCHC: 32.5 g/dL (ref 30.0–36.0)
MCV: 90.3 fL (ref 80.0–100.0)
Monocytes Absolute: 0.3 10*3/uL (ref 0.1–1.0)
Monocytes Relative: 8 %
Neutro Abs: 2.1 10*3/uL (ref 1.7–7.7)
Neutrophils Relative %: 65 %
Platelets: 90 10*3/uL — ABNORMAL LOW (ref 150–400)
RBC: 2.79 MIL/uL — ABNORMAL LOW (ref 3.87–5.11)
RDW: 18.6 % — ABNORMAL HIGH (ref 11.5–15.5)
WBC: 3.2 10*3/uL — ABNORMAL LOW (ref 4.0–10.5)
nRBC: 0 % (ref 0.0–0.2)

## 2020-04-11 LAB — TYPE AND SCREEN
ABO/RH(D): O POS
Antibody Screen: NEGATIVE
Unit division: 0
Unit division: 0

## 2020-04-11 LAB — MAGNESIUM: Magnesium: 2.5 mg/dL — ABNORMAL HIGH (ref 1.7–2.4)

## 2020-04-11 LAB — BPAM RBC
Blood Product Expiration Date: 202202032359
Blood Product Expiration Date: 202202032359
ISSUE DATE / TIME: 202112301457
ISSUE DATE / TIME: 202112301930
Unit Type and Rh: 5100
Unit Type and Rh: 5100

## 2020-04-11 LAB — BASIC METABOLIC PANEL
Anion gap: 11 (ref 5–15)
BUN: 88 mg/dL — ABNORMAL HIGH (ref 8–23)
CO2: 22 mmol/L (ref 22–32)
Calcium: 9.3 mg/dL (ref 8.9–10.3)
Chloride: 105 mmol/L (ref 98–111)
Creatinine, Ser: 1.93 mg/dL — ABNORMAL HIGH (ref 0.44–1.00)
GFR, Estimated: 26 mL/min — ABNORMAL LOW (ref >60)
Glucose, Bld: 90 mg/dL (ref 70–99)
Potassium: 4.7 mmol/L (ref 3.5–5.1)
Sodium: 138 mmol/L (ref 135–145)

## 2020-04-11 LAB — LACTATE DEHYDROGENASE: LDH: 156 U/L (ref 98–192)

## 2020-04-11 MED ORDER — SPIRONOLACTONE 12.5 MG HALF TABLET
12.5000 mg | ORAL_TABLET | Freq: Every day | ORAL | Status: DC
  Administered 2020-04-11 – 2020-04-12 (×2): 12.5 mg via ORAL
  Filled 2020-04-11 (×2): qty 1

## 2020-04-11 NOTE — Progress Notes (Signed)
Progress Note    Kelly Jimenez  OFV:[id number] DOB: [birth year]  DOA: 04/09/2020 PCP: Patient, No Pcp Per    Brief Narrative:     Medical records reviewed and are as summarized below:  Kelly Jimenez is an 79 y.o. 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  Patient is visiting from [facility] to spend holidays with her family.    Assessment/Plan:   Principal Problem:   Acute on chronic diastolic CHF (congestive heart failure) (HCC) Active Problems:   Pancytopenia (HCC)   Elevated troponin   Acute kidney injury superimposed on chronic kidney disease (HCC)   Primary biliary cirrhosis (HCC)   Mantle cell lymphoma (HCC)   Immunosuppression due to drug therapy (HCC)   Acute on chronic diastolic congestive heart failure:  -Patient presents with complaints of shortness of breath.  CT imaging revealed small bilateral pleural effusions right greater than left with mild bilateral patchy airspace opacities that could be suggestive of interstitial edema versus atelectasis/infection.   -BNP was elevated at 470.7. -Strict I&Os and daily weights- unfortunately her daily weights range from 160lbs to 410lbs so doubt this is accurate -Elevate lower extremities and place TED hose -TSH elevated so add on free t4 -Lasix 40 mg IV twice daily-- continue to hope for correct I/Os -per cards note review from her doctor in [place]: was to start spiro and d/c kdur -per his evaluation had 2+ edema in Novemeber  Elevated troponin:  -flat and unconcerning  Shortness of breath: Patient symptoms could likely be multifactorial in the setting of CHF/anemia/pulmonary artery  hypertension, but given patient's recent flight traveling from [facility] would like to rule out the possibility of a DVT. - D-dimer elevated -V/Q pending -duplex LE negative -pateint much improved with blood and diuresisi  Pancytopenia: Acute on chronic.   -Hemoglobin dropped to < 7, but previously had about around 8.4 per review of records from [facility] from 12/17.  -Check stool guaiac but suspect related to cancer treatment -Transfuse 2 unit of packed red blood cells with good result  Acute kidney injury superimposed on chronic kidney disease stage IIIb:  -baseline Cr unclear -on 12/17: BUN 66, Cr 1.6  Mantle cell lymphoma: Patient reports that she had been receiving treatment with Rituxan and subsequently Revlimid up into the last month when treatment had to be stopped due to reaction.  History of liver transplant on chronic immunosuppressive therapy: Home medications include CellCept 250 mg twice daily, tacrolimus 1 mg in a.m., and 0.5 mg at night. -Continue home regimen  Primary biliary cirrhosis: Home medications include ursodiol 500 mg twice daily. -Continue ursodiol  Essential hypertension:  -hold norvasc -continue IV lasix  obesity Body mass index is 30.25 kg/m.   Family Communication/Anticipated D/C date and plan/Code Status   DVT prophylaxis: Lovenox ordered. Code Status: Full Code.   Disposition Plan: Status is: Observation  The patient will require care spanning > 2 midnights and should be moved to inpatient because: Inpatient level of care appropriate due to severity of illness  Dispo: The patient is from: Home              Anticipated d/c is to: Home              Anticipated d/c date is:  2 days              Patient currently is not medically stable to d/c.         Medical Consultants:    None.    Subjective:   Feels like her SOB is better  Objective:    Vitals:   04/10/20 2327 04/11/20 0336 04/11/20 0739 04/11/20 1115   BP: (!) 144/50     Pulse: 92 96 96 87  Resp: 19 17    Temp: 97.6 F (36.4 C) (!) 97.5 F (36.4 C) (!) 97.2 F (36.2 C) 97.6 F (36.4 C)  TempSrc: Oral Oral Oral Oral  SpO2: 98% 96% 97% 97%  Weight:  85 kg    Height:        Intake/Output Summary (Last 24 hours) at 04/11/2020 1620 Last data filed at 04/11/2020 1255 Gross per 24 hour  Intake 1510.42 ml  Output 2100 ml  Net -589.58 ml   Filed Weights   04/09/20 1304 04/10/20 1138 04/11/20 0336  Weight: 72.6 kg (!) 186 kg 85 kg    Exam:  General: Appearance:    Obese female in no acute distress     Lungs:     Diminished at bases, respirations unlabored  Heart:    Normal heart rate. Normal rhythm. No murmurs, rubs, or gallops.   MS:   All extremities are intact. +LE edema  Neurologic:   Awake, alert, oriented x 3. No apparent focal neurological           defect.     Data Reviewed:   I have personally reviewed following labs and imaging studies:  Labs: Labs show the following:   Basic Metabolic Panel: Recent Labs  Lab 04/09/20 1306 04/11/20 0124  NA 135 138  K 3.9 4.7  CL 102 105  CO2 20* 22  GLUCOSE 113* 90  BUN 86* 88*  CREATININE 1.71* 1.93*  CALCIUM 9.6 9.3  MG  --  2.5*   GFR Estimated Creatinine Clearance: 26.4 mL/min (A) (by C-G formula based on SCr of 1.93 mg/dL (H)). Liver Function Tests: No results for input(s): AST, ALT, ALKPHOS, BILITOT, PROT, ALBUMIN in the last 168 hours. No results for input(s): LIPASE, AMYLASE in the last 168 hours. No results for input(s): AMMONIA in the last 168 hours. Coagulation profile No results for input(s): INR, PROTIME in the last 168 hours.  CBC: Recent Labs  Lab 04/09/20 1306 04/10/20 1324 04/11/20 0124  WBC 3.5*  --  3.2*  NEUTROABS  --   --  2.1  HGB 7.1* 6.2* 8.2*  HCT 22.9* 19.8* 25.2*  MCV 96.6  --  90.3  PLT 94*  --  90*   Cardiac Enzymes: No results for input(s): CKTOTAL, CKMB, CKMBINDEX, TROPONINI in the last 168 hours. BNP (last 3  results) No results for input(s): PROBNP in the last 8760 hours. CBG: No results for input(s): GLUCAP in the last 168 hours. D-Dimer: Recent Labs    04/10/20 1324  DDIMER 1.26*   Hgb A1c: No results for input(s): HGBA1C in the last 72 hours. Lipid Profile: No results for input(s): CHOL, HDL, LDLCALC, TRIG, CHOLHDL, LDLDIRECT in the last 72 hours. Thyroid function studies: Recent Labs    04/10/20 1324  TSH 6.764*   Anemia work up: No results for input(s): VITAMINB12, FOLATE, FERRITIN, TIBC, IRON, RETICCTPCT in the last 72 hours. Sepsis Labs: Recent Labs  Lab 04/09/20 1306 04/11/20 0124  WBC 3.5* 3.2*    Microbiology Recent Results (from  the past 240 hour(s))  SARS CORONAVIRUS 2 (TAT 6-24 HRS) Nasopharyngeal Nasopharyngeal Swab     Status: None   Collection Time: 04/10/20  2:50 AM   Specimen: Nasopharyngeal Swab  Result Value Ref Range Status   SARS Coronavirus 2 NEGATIVE NEGATIVE Final    Comment: (NOTE) SARS-CoV-2 target nucleic acids are NOT DETECTED.  The SARS-CoV-2 RNA is generally detectable in upper and lower respiratory specimens during the acute phase of infection. Negative results do not preclude SARS-CoV-2 infection, do not rule out co-infections with other pathogens, and should not be used as the sole basis for treatment or other patient management decisions. Negative results must be combined with clinical observations, patient history, and epidemiological information. The expected result is Negative.  Fact Sheet for Patients: HairSlick.no  Fact Sheet for Healthcare Providers: quierodirigir.com  This test is not yet approved or cleared by the [place] FDA and  has been authorized for detection and/or diagnosis of SARS-CoV-2 by FDA under an Emergency Use Authorization (EUA). This EUA will remain  in effect (meaning this test can be used) for the duration of the COVID-19 declaration under Se  ction 564(b)(1) of the Act, 21 U.S.C. section 360bbb-3(b)(1), unless the authorization is terminated or revoked sooner.  Performed at [facility], [address] [place], [place] [address]   MRSA PCR Screening     Status: None   Collection Time: 04/10/20 10:26 AM   Specimen: Nasopharyngeal  Result Value Ref Range Status   MRSA by PCR NEGATIVE NEGATIVE Final    Comment:        The GeneXpert MRSA Assay (FDA approved for NASAL specimens only), is one component of a comprehensive MRSA colonization surveillance program. It is not intended to diagnose MRSA infection nor to guide or monitor treatment for MRSA infections. Performed at [facility], [address] [place], [place] [address]     Procedures and diagnostic studies:  CT Chest Wo Contrast  Result Date: 04/10/2020 CLINICAL DATA:  Chest pain and shortness of breath EXAM: CT CHEST WITHOUT CONTRAST TECHNIQUE: Multidetector CT imaging of the chest was performed following the standard protocol without IV contrast. COMPARISON:  None. FINDINGS: Cardiovascular: Scattered aortic atherosclerosis is seen. Coronary artery calcifications are noted. The heart size is normal. There is no pericardial thickening or effusion. Mediastinum/Nodes: There are no enlarged mediastinal, hilar or axillary lymph nodes, however limited due to lack of intravenous contrast. The thyroid gland, trachea and esophagus demonstrate no significant findings. Lungs/Pleura: Small bilateral pleural effusions are seen, right greater than left. Minimal patchy airspace opacity seen at both lung bases. Mildly increased interlobular septal thickening is noted. There is a patchy airspace opacity seen at the periphery of the left lung base and at the right middle lobe and right lung base. Upper abdomen: The visualized portion of the upper abdomen is unremarkable. Musculoskeletal/Chest wall: There is no chest wall mass or suspicious osseous finding. No acute osseous  abnormality IMPRESSION: Small bilateral pleural effusions, right greater than left. Mild bilateral patchy airspace opacities most notable in the right middle lobe and right lung base which are nonspecific could be due to atelectasis and/or infectious etiology. Findings which could be suggestive of interstitial edema. Electronically Signed   By: Jonna Clark M.D.   On: 04/10/2020 01:28   ECHOCARDIOGRAM COMPLETE  Result Date: 04/10/2020    ECHOCARDIOGRAM REPORT   Patient Name:   SHRUTHI NORTHRUP Date of Exam: 04/10/2020 Medical Rec #:  [id number]  Height:       66.0 in Accession #:    [id number]      Weight:       160.0 lb Date of Birth:  10/26/1940       BSA:          1.819 m Patient Age:    54 years        BP:           109/53 mmHg Patient Gender: F               HR:           82 bpm. Exam Location:  Inpatient Procedure: 2D Echo, Color Doppler and Cardiac Doppler Indications:    CHF-Acute Systolic [id number]  History:        Patient has no prior history of Echocardiogram examinations.  Sonographer:    Bernadene Person RDCS Referring Phys: [id number] RONDELL A SMITH IMPRESSIONS  1. Left ventricular ejection fraction, by estimation, is 55 to 60%. The left ventricle has normal function. The left ventricle has no regional wall motion abnormalities. Left ventricular diastolic parameters are consistent with Grade II diastolic dysfunction (pseudonormalization).  2. Right ventricular systolic function is normal. The right ventricular size is normal. There is mildly elevated pulmonary artery systolic pressure. The estimated right ventricular systolic pressure is 0000000 mmHg.  3. Left atrial size was severely dilated.  4. The mitral valve is normal in structure. Mild mitral valve regurgitation. No evidence of mitral stenosis.  5. Tricuspid valve regurgitation is moderate.  6. The aortic valve is normal in structure. Aortic valve regurgitation is not visualized. No aortic stenosis is present.  7. The inferior vena cava is normal  in size with greater than 50% respiratory variability, suggesting right atrial pressure of 3 mmHg. FINDINGS  Left Ventricle: Left ventricular ejection fraction, by estimation, is 55 to 60%. The left ventricle has normal function. The left ventricle has no regional wall motion abnormalities. The left ventricular internal cavity size was normal in size. There is  no left ventricular hypertrophy. Left ventricular diastolic parameters are consistent with Grade II diastolic dysfunction (pseudonormalization).  LV Wall Scoring: The posterior wall is akinetic. Right Ventricle: The right ventricular size is normal. No increase in right ventricular wall thickness. Right ventricular systolic function is normal. There is mildly elevated pulmonary artery systolic pressure. The tricuspid regurgitant velocity is 2.96  m/s, and with an assumed right atrial pressure of 8 mmHg, the estimated right ventricular systolic pressure is 0000000 mmHg. Left Atrium: Left atrial size was severely dilated. Right Atrium: Right atrial size was normal in size. Pericardium: There is no evidence of pericardial effusion. Mitral Valve: The mitral valve is normal in structure. Mild mitral valve regurgitation. No evidence of mitral valve stenosis. Tricuspid Valve: The tricuspid valve is normal in structure. Tricuspid valve regurgitation is moderate . No evidence of tricuspid stenosis. Aortic Valve: The aortic valve is normal in structure. Aortic valve regurgitation is not visualized. No aortic stenosis is present. Pulmonic Valve: The pulmonic valve was normal in structure. Pulmonic valve regurgitation is not visualized. No evidence of pulmonic stenosis. Aorta: The aortic root is normal in size and structure. Venous: The inferior vena cava is normal in size with greater than 50% respiratory variability, suggesting right atrial pressure of 3 mmHg. IAS/Shunts: No atrial level shunt detected by color flow Doppler.  LEFT VENTRICLE PLAX 2D LVIDd:         4.90 cm   Diastology LVIDs:  2.50 cm  LV e' medial:    8.59 cm/s LV PW:         1.00 cm  LV E/e' medial:  20.4 LV IVS:        1.10 cm  LV e' lateral:   8.55 cm/s LVOT diam:     2.20 cm  LV E/e' lateral: 20.5 LV SV:         154 LV SV Index:   84 LVOT Area:     3.80 cm  RIGHT VENTRICLE RV S prime:     16.30 cm/s TAPSE (M-mode): 3.3 cm LEFT ATRIUM              Index       RIGHT ATRIUM           Index LA diam:        4.60 cm  2.53 cm/m  RA Area:     22.80 cm LA Vol (A2C):   95.5 ml  52.51 ml/m RA Volume:   66.90 ml  36.78 ml/m LA Vol (A4C):   99.4 ml  54.65 ml/m LA Biplane Vol: 108.0 ml 59.38 ml/m  AORTIC VALVE LVOT Vmax:   158.00 cm/s LVOT Vmean:  120.000 cm/s LVOT VTI:    0.404 m  AORTA Ao Root diam: 2.90 cm Ao Asc diam:  3.30 cm MITRAL VALVE                TRICUSPID VALVE MV Area (PHT): 3.27 cm     TR Peak grad:   35.0 mmHg MV Decel Time: 232 msec     TR Vmax:        296.00 cm/s MV E velocity: 175.00 cm/s MV A velocity: 110.00 cm/s  SHUNTS MV E/A ratio:  1.59         Systemic VTI:  0.40 m                             Systemic Diam: 2.20 cm Candee Furbish MD Electronically signed by Candee Furbish MD Signature Date/Time: 04/10/2020/2:00:06 PM    Final    VAS [place] LOWER EXTREMITY VENOUS (DVT)  Result Date: 04/10/2020  Lower Venous DVT Study Indications: Edema, Swelling, and CHF.  Comparison Study: No prior Performing Technologist: Oda Cogan RDMS, RVT  Examination Guidelines: A complete evaluation includes B-mode imaging, spectral Doppler, color Doppler, and power Doppler as needed of all accessible portions of each vessel. Bilateral testing is considered an integral part of a complete examination. Limited examinations for reoccurring indications may be performed as noted. The reflux portion of the exam is performed with the patient in reverse Trendelenburg.  +---------+---------------+---------+-----------+----------+--------------+ RIGHT    CompressibilityPhasicitySpontaneityPropertiesThrombus Aging  +---------+---------------+---------+-----------+----------+--------------+ CFV      Full           Yes      Yes                                 +---------+---------------+---------+-----------+----------+--------------+ SFJ      Full                                                        +---------+---------------+---------+-----------+----------+--------------+ FV Prox  Full                                                        +---------+---------------+---------+-----------+----------+--------------+  FV Mid   Full                                                        +---------+---------------+---------+-----------+----------+--------------+ FV DistalFull                                                        +---------+---------------+---------+-----------+----------+--------------+ PFV      Full                                                        +---------+---------------+---------+-----------+----------+--------------+ POP      Full           Yes      Yes                                 +---------+---------------+---------+-----------+----------+--------------+ PTV      Full                                                        +---------+---------------+---------+-----------+----------+--------------+ PERO     Full                                                        +---------+---------------+---------+-----------+----------+--------------+ Small cystic structure behind the right knee.  +---------+---------------+---------+-----------+----------+--------------+ LEFT     CompressibilityPhasicitySpontaneityPropertiesThrombus Aging +---------+---------------+---------+-----------+----------+--------------+ CFV      Full           Yes      Yes                                 +---------+---------------+---------+-----------+----------+--------------+ SFJ      Full                                                         +---------+---------------+---------+-----------+----------+--------------+ FV Prox  Full                                                        +---------+---------------+---------+-----------+----------+--------------+ FV Mid   Full                                                        +---------+---------------+---------+-----------+----------+--------------+  FV DistalFull                                                        +---------+---------------+---------+-----------+----------+--------------+ PFV      Full                                                        +---------+---------------+---------+-----------+----------+--------------+ POP      Full           Yes      Yes                                 +---------+---------------+---------+-----------+----------+--------------+ PTV      Full                                                        +---------+---------------+---------+-----------+----------+--------------+ PERO     Full                                                        +---------+---------------+---------+-----------+----------+--------------+     Summary: BILATERAL: - No evidence of deep vein thrombosis seen in the lower extremities, bilaterally. -No evidence of popliteal cyst, bilaterally.   *See table(s) above for measurements and observations. Electronically signed by Servando Snare MD on 04/10/2020 at 8:17:03 PM.    Final     Medications:   . atorvastatin  10 mg Oral QHS  . furosemide  40 mg Intravenous Once  . furosemide  40 mg Intravenous BID  . multivitamin with minerals  1 tablet Oral Daily  . mycophenolate  250 mg Oral BID  . psyllium  1 packet Oral Daily  . sodium chloride flush  3 mL Intravenous Q12H  . tacrolimus  0.5 mg Oral QPM  . tacrolimus  1 mg Oral q AM  . ursodiol  600 mg Oral BID   Continuous Infusions: . sodium chloride       LOS: 0 days   Geradine Girt  [facility]   How to contact  the [facility] Attending or Consulting provider [place] or covering provider during after hours [place], for this patient?  1. Check the care team in [facility] and look for a) attending/consulting [facility] provider listed and b) the [facility] team listed 2. Log into [email or web address] and use [place]'s universal password to access. If you do not have the password, please contact the hospital operator. 3. Locate the [facility] provider you are looking for under [facility] and page to a number that you can be directly reached. 4. If you still have difficulty reaching the provider, please page the [facility] (Director on Call) for the Hospitalists listed on amion for assistance.  04/11/2020, 4:20 PM

## 2020-04-11 NOTE — Progress Notes (Signed)
Requested for SCD, awaiting availability.

## 2020-04-11 NOTE — Evaluation (Signed)
Physical Therapy Evaluation Patient Details Name: Kelly Jimenez MRN: [id number] DOB: [birth year] Today's Date: 04/11/2020   History of Present Illness  Kelly ReasKaren Jimenez is a 79 y.o. 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  Clinical Impression  Pt admitted with above diagnosis. Pt was able to ambulate in hallway with cane and UE support for balance.  Pt states she is close to her baseline and her gait did improve as she incr distance. Pt and husband decline RW and HH f/u. Husband will assist pt x 24 hours/day.  Pt currently with functional limitations due to the deficits listed below (see PT Problem List). Pt will benefit from skilled PT to increase their independence and safety with mobility to allow discharge to the venue listed below.      Follow Up Recommendations No PT follow up;Supervision/Assistance - 24 hour (husband states he will help her and they dont want HH f/u)    Equipment Recommendations  None recommended by PT (Recommended RW but pt declined)    Recommendations for Other Services       Precautions / Restrictions Precautions Precautions: Fall Restrictions Weight Bearing Restrictions: No      Mobility  Bed Mobility Overal bed mobility: Independent                  Transfers Overall transfer level: Needs assistance Equipment used: Straight cane Transfers: Sit to/from Stand Sit to Stand: Min assist;Min guard         General transfer comment: Needed steadying assist standing at side of bed as pt leaned on bed initially to power up until steady on her feet.  Ambulation/Gait Ambulation/Gait assistance: Min assist;Min guard Gait Distance  (Feet): 130 Feet Assistive device: Straight cane Gait Pattern/deviations: Decreased step length - right;Decreased step length - left;Step-to pattern;Decreased stride length;Antalgic;Drifts right/left;Trunk flexed;Wide base of support   Gait velocity interpretation: <1.31 ft/sec, indicative of household ambulator General Gait Details: Pt uses cane in right hand but furniture walks with left hand or reaches for PT's hand. Did not want to use RW as she states she just hasnt been up in a few days.  Her gait did get better the more she walked.  Pt without LOB however needs steadying assist at times. Pt walked into bathroom and then to hallway. She also washed her hands at the sink. Pt and husband state that pt is close to her baseline.  Stairs            Wheelchair Mobility    Modified Rankin (Stroke Patients Only)       Balance Overall balance assessment: Needs assistance Sitting-balance support: No upper extremity supported;Feet supported Sitting balance-Leahy Scale: Good     Standing balance support: Single extremity supported;During functional activity Standing balance-Leahy Scale: Poor Standing balance comment: relies on 1 - 2 UE support for balance and cannot accept challenges to balance                             Pertinent Vitals/Pain Pain Assessment: No/denies pain    Home Living Family/patient expects to be discharged to:: Private residence Living Arrangements: Spouse/significant other Available Help at Discharge: Family;Available 24 hours/day Type of Home: House Home Access: Stairs to enter Entrance Stairs-Rails: Right  Entrance Stairs-Number of Steps: 5 Home Layout: Two level;Bed/bath upstairs Home Equipment: Cane - single point Additional Comments: Pt is from [place] visiting for Christmas    Prior Function Level of Independence: Independent with assistive device(s)         Comments: used cane in community only     Hand Dominance         Extremity/Trunk Assessment   Upper Extremity Assessment Upper Extremity Assessment: Defer to OT evaluation    Lower Extremity Assessment Lower Extremity Assessment: Overall WFL for tasks assessed    Cervical / Trunk Assessment Cervical / Trunk Assessment: Kyphotic  Communication   Communication: No difficulties  Cognition Arousal/Alertness: Awake/alert Behavior During Therapy: WFL for tasks assessed/performed Overall Cognitive Status: Within Functional Limits for tasks assessed                                        General Comments General comments (skin integrity, edema, etc.): VSS during session.    Exercises     Assessment/Plan    PT Assessment Patient needs continued PT services  PT Problem List Decreased activity tolerance;Decreased balance;Decreased mobility;Decreased knowledge of use of DME;Decreased safety awareness;Decreased knowledge of precautions       PT Treatment Interventions DME instruction;Functional mobility training;Therapeutic activities;Therapeutic exercise;Balance training;Patient/family education;Gait training;Stair training    PT Goals (Current goals can be found in the Care Plan section)  Acute Rehab PT Goals Patient Stated Goal: to go home PT Goal Formulation: With patient Time For Goal Achievement: 04/25/20 Potential to Achieve Goals: Good    Frequency Min 3X/week   Barriers to discharge        Co-evaluation               AM-PAC PT "6 Clicks" Mobility  Outcome Measure Help needed turning from your back to your side while in a flat bed without using bedrails?: None Help needed moving from lying on your back to sitting on the side of a flat bed without using bedrails?: None Help needed moving to and from a bed to a chair (including a wheelchair)?: A Little Help needed standing up from a chair using your arms (e.g., wheelchair or bedside chair)?: A Little Help needed to walk in hospital room?: A Little Help  needed climbing 3-5 steps with a railing? : A Little 6 Click Score: 20    End of Session Equipment Utilized During Treatment: Gait belt Activity Tolerance: Patient limited by fatigue Patient left: in chair;with call bell/phone within reach;with family/visitor present Nurse Communication: Mobility status PT Visit Diagnosis: Muscle weakness (generalized) (M62.81)    Time: 2947-6546 PT Time Calculation (min) (ACUTE ONLY): 26 min   Charges:   PT Evaluation $PT Eval Moderate Complexity: 1 Mod PT Treatments $Gait Training: 8-22 mins        Ayaka Andes W,PT Acute Rehabilitation Services Pager:  [phone]  Office:  [phone]    Berline Lopes 04/11/2020, 2:41 PM

## 2020-04-11 NOTE — Progress Notes (Signed)
Occupational Therapy Evaluation  PTA pt lives independently with her husband, drives and only uses a cane for community ambulation. Pt overall set/supervision for ADL tasks and S for mobility @ RW level. VSS during session. Recommend pt use a RW to ambulate with staff. Educated pt on energy conservation and strategies to reduce risk of falls. Educated pt/husband on benefit of mobility and being OOB as much as possible. Encouraged chair level exercises.  No further OT needed. OT signing off.     04/11/20 1500  OT Visit Information  Last OT Received On 04/11/20  Assistance Needed +1  History of Present Illness Kelly Jimenez is a 79 y.o. 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  Precautions  Precautions Fall  Home Living  Family/patient expects to be discharged to: Private residence  Living Arrangements Spouse/significant other  Available Help at Discharge Family;Available 24 hours/day  Type of Home House  Home Access Stairs to enter  Entrance Stairs-Number of Steps 5  Entrance Stairs-Rails Right  Home Layout Two level;Bed/bath upstairs  Alternate Level Stairs-Number of Steps flight  Alternate Level Stairs-Rails Right  Bathroom Shower/Tub Walk-in [employer] technical [employer] - single point  Additional Comments Pt is from [facility] visiting for Christmas  Prior Function  Level of Independence Independent with assistive device(s)  Comments used cane in community only  Communication  Communication No difficulties  Pain Assessment  Pain Assessment Faces  Faces Pain Scale 2  Pain Location back  Pain Descriptors / Indicators Discomfort  Pain  Intervention(s) Limited activity within patient's tolerance  Cognition  Arousal/Alertness Awake/alert  Behavior During Therapy WFL for tasks assessed/performed  Overall Cognitive Status Within Functional Limits for tasks assessed  Upper Extremity Assessment  Upper Extremity Assessment Overall WFL for tasks assessed  Lower Extremity Assessment  Lower Extremity Assessment Defer to PT evaluation  Cervical / Trunk Assessment  Cervical / Trunk Assessment Kyphotic  ADL  Overall ADL's  Needs assistance/impaired  Functional mobility during ADLs Supervision/safety;Rolling walker  General ADL Comments Pt overall set up/S for ADL tasks. Educated pt/husband on energy conservation strateiges adn strateiges to reduce risk of falls. Recommend use of shower chair at home.  Vision- History  Baseline Vision/History Wears glasses  Bed Mobility  Overal bed mobility Independent  Transfers  Overall transfer level Needs assistance  Equipment used Straight cane;Rolling walker (2 wheeled)  Transfers Sit to/from Stand  Sit to Stand Supervision  Balance  Overall balance assessment Needs assistance  Sitting-balance support No upper extremity supported;Feet supported  Sitting balance-Leahy Scale Good  Standing balance support Single extremity supported;During functional activity  Standing balance-Leahy Scale Poor  Standing balance comment does better with RW  General Comments  General comments (skin integrity, edema, etc.) VSS on RA  OT - End of Session  Equipment Utilized During Treatment Gait belt;Rolling walker  Activity Tolerance Patient tolerated treatment well  Patient left in chair;with call bell/phone within reach;with family/visitor present  Nurse Communication Mobility status  OT Assessment  OT Recommendation/Assessment Patient does not need any further OT services  OT Visit Diagnosis Unsteadiness on feet (R26.81);Other abnormalities of gait and mobility (R26.89);Muscle weakness (generalized)  (M62.81);Pain  Pain - part of body  (back)  OT Problem List Decreased activity tolerance;Impaired balance (sitting  and/or standing);Decreased knowledge of use of DME or AE;Cardiopulmonary status limiting activity;Pain  AM-PAC OT "6 Clicks" Daily Activity Outcome Measure (Version 2)  Help from another person eating meals? 4  Help from another person taking care of personal grooming? 3  Help from another person toileting, which includes using toliet, bedpan, or urinal? 3  Help from another person bathing (including washing, rinsing, drying)? 3  Help from another person to put on and taking off regular upper body clothing? 3  Help from another person to put on and taking off regular lower body clothing? 3  6 Click Score 19  OT Recommendation  Follow Up Recommendations No OT follow up;Supervision - Intermittent  OT Equipment Other (comment) (recommend shower chair once back home)  Acute Rehab OT Goals  Patient Stated Goal to go home  OT Goal Formulation All assessment and education complete, DC therapy  OT Time Calculation  OT Start Time (ACUTE ONLY) 1515  OT Stop Time (ACUTE ONLY) 1545  OT Time Calculation (min) 30 min  OT General Charges  $OT Visit 1 Visit  OT Evaluation  $OT Eval Low Complexity 1 Low  Written Expression  Dominant Hand Right  Luisa Dago, OT/L   Acute OT Clinical Specialist Acute Rehabilitation Services Pager [phone] Office [phone]

## 2020-04-12 ENCOUNTER — Inpatient Hospital Stay (HOSPITAL_COMMUNITY): Payer: Medicare Other

## 2020-04-12 DIAGNOSIS — I5033 Acute on chronic diastolic (congestive) heart failure: Secondary | ICD-10-CM | POA: Diagnosis not present

## 2020-04-12 LAB — CBC
HCT: 26 % — ABNORMAL LOW (ref 36.0–46.0)
Hemoglobin: 8.3 g/dL — ABNORMAL LOW (ref 12.0–15.0)
MCH: 29.2 pg (ref 26.0–34.0)
MCHC: 31.9 g/dL (ref 30.0–36.0)
MCV: 91.5 fL (ref 80.0–100.0)
Platelets: 81 10*3/uL — ABNORMAL LOW (ref 150–400)
RBC: 2.84 MIL/uL — ABNORMAL LOW (ref 3.87–5.11)
RDW: 18.6 % — ABNORMAL HIGH (ref 11.5–15.5)
WBC: 3.1 10*3/uL — ABNORMAL LOW (ref 4.0–10.5)
nRBC: 0 % (ref 0.0–0.2)

## 2020-04-12 LAB — COMPREHENSIVE METABOLIC PANEL
ALT: 34 U/L (ref 0–44)
AST: 41 U/L (ref 15–41)
Albumin: 2.9 g/dL — ABNORMAL LOW (ref 3.5–5.0)
Alkaline Phosphatase: 187 U/L — ABNORMAL HIGH (ref 38–126)
Anion gap: 12 (ref 5–15)
BUN: 87 mg/dL — ABNORMAL HIGH (ref 8–23)
CO2: 24 mmol/L (ref 22–32)
Calcium: 9 mg/dL (ref 8.9–10.3)
Chloride: 103 mmol/L (ref 98–111)
Creatinine, Ser: 1.81 mg/dL — ABNORMAL HIGH (ref 0.44–1.00)
GFR, Estimated: 28 mL/min — ABNORMAL LOW (ref >60)
Glucose, Bld: 86 mg/dL (ref 70–99)
Potassium: 3.9 mmol/L (ref 3.5–5.1)
Sodium: 139 mmol/L (ref 135–145)
Total Bilirubin: 0.7 mg/dL (ref 0.3–1.2)
Total Protein: 6.4 g/dL — ABNORMAL LOW (ref 6.5–8.1)

## 2020-04-12 LAB — T4, FREE: Free T4: 2.58 ng/dL — ABNORMAL HIGH (ref 0.61–1.12)

## 2020-04-12 LAB — OCCULT BLOOD X 1 CARD TO LAB, STOOL: Fecal Occult Bld: POSITIVE — AB

## 2020-04-12 MED ORDER — ATORVASTATIN CALCIUM 10 MG PO TABS: 10.0000 mg | ORAL_TABLET | Freq: Every day | ORAL | 0 refills | Status: AC

## 2020-04-12 MED ORDER — MYCOPHENOLATE MOFETIL 250 MG PO CAPS: 250.0000 mg | ORAL_CAPSULE | Freq: Two times a day (BID) | ORAL | 0 refills | Status: AC

## 2020-04-12 MED ORDER — TECHNETIUM TO 99M ALBUMIN AGGREGATED
4.4000 | Freq: Once | INTRAVENOUS | Status: AC | PRN
  Administered 2020-04-12: 4.4 via INTRAVENOUS

## 2020-04-12 MED ORDER — SPIRONOLACTONE 25 MG PO TABS: 12.5000 mg | ORAL_TABLET | Freq: Every day | ORAL | 0 refills | Status: AC

## 2020-04-12 MED ORDER — PSYLLIUM 0.52 G PO CAPS: 0.5200 g | ORAL_CAPSULE | Freq: Every day | ORAL | 0 refills | Status: AC

## 2020-04-12 MED ORDER — TACROLIMUS 1 MG PO CAPS: 1.0000 mg | ORAL_CAPSULE | Freq: Every morning | ORAL | 0 refills | Status: AC

## 2020-04-12 MED ORDER — TACROLIMUS 0.5 MG PO CAPS: 0.5000 mg | ORAL_CAPSULE | Freq: Every evening | ORAL | 0 refills | Status: AC

## 2020-04-12 MED ORDER — URSODIOL 500 MG PO TABS: 500.0000 mg | ORAL_TABLET | Freq: Two times a day (BID) | ORAL | 0 refills | Status: AC

## 2020-04-12 MED ORDER — ALLOPURINOL 300 MG PO TABS: 300.0000 mg | ORAL_TABLET | Freq: Every day | ORAL | 0 refills | Status: AC

## 2020-04-12 NOTE — Plan of Care (Signed)
  Problem: Education: Goal: Knowledge of General Education information will improve Description: Including pain rating scale, medication(s)/side effects and non-pharmacologic comfort measures Outcome: Completed/Met   Problem: Health Behavior/Discharge Planning: Goal: Ability to manage health-related needs will improve Outcome: Completed/Met   Problem: Clinical Measurements: Goal: Ability to maintain clinical measurements within normal limits will improve Outcome: Completed/Met Goal: Will remain free from infection Outcome: Completed/Met Goal: Diagnostic test results will improve Outcome: Completed/Met Goal: Respiratory complications will improve Outcome: Completed/Met Goal: Cardiovascular complication will be avoided Outcome: Completed/Met   Problem: Activity: Goal: Risk for activity intolerance will decrease Outcome: Completed/Met   Problem: Nutrition: Goal: Adequate nutrition will be maintained Outcome: Completed/Met   Problem: Coping: Goal: Level of anxiety will decrease Outcome: Completed/Met   Problem: Pain Managment: Goal: General experience of comfort will improve Outcome: Completed/Met   Problem: Safety: Goal: Ability to remain free from injury will improve Outcome: Completed/Met   Problem: Skin Integrity: Goal: Risk for impaired skin integrity will decrease Outcome: Completed/Met   Problem: Acute Rehab PT Goals(only PT should resolve) Goal: Patient Will Transfer Sit To/From Stand Outcome: Completed/Met Goal: Pt Will Ambulate Outcome: Completed/Met Goal: Pt Will Go Up/Down Stairs Outcome: Completed/Met

## 2020-04-12 NOTE — Discharge Summary (Incomplete)
Physician Discharge Summary  Kelly Jimenez [id number] DOB: May 22, 1940 DOA: 04/09/2020  PCP: Patient, No Pcp Per  Admit date: 04/09/2020 Discharge date: 04/12/2020  Admitted From: Home Discharge disposition: Home   Recommendations for Outpatient Follow-Up:   1. BMP on Monday when patient returned to home state regarding potassium and creatinine 2. CBC at that time as well   Discharge Diagnosis:   Principal Problem:   Acute on chronic diastolic CHF (congestive heart failure) (HCC) Active Problems:   Pancytopenia (HCC)   Elevated troponin   Acute kidney injury superimposed on chronic kidney disease (HCC)   Primary biliary cirrhosis (HCC)   Mantle cell lymphoma (HCC)   Immunosuppression due to drug therapy [facility])    Discharge Condition: Improved.  Diet recommendation: Low sodium, heart healthy  Wound care: None.  Code status: Full.   History of Present Illness:  Kelly Jimenez is a 80 y.o. 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  Patient is visiting from [place] to spend holidays with her family.  The plane ride lasted approximately 5 or 6 hours for which the patient states that she did not get up during the flight, but wore compression stockings. Denies having any calf pain, chest pain, nausea, vomiting, or diarrhea.  She reports that she had been diagnosed with mantle cell lymphoma in June of this year and had initially been on Rituxan, but was switched over to Revlimid.  Last dose of Revlimid was over a month ago where she reports that it caused lower extremity swelling, shortness of breath, and had to break out in a diffuse rash.  It appears  she followed up with cardiology and oncology in regards to her symptoms.  She recently had CTA of the chest that showed no acute abnormalities and echocardiogram from 03/05/2020 had revealed EF of 76% with grade 3 diastolic dysfunction, moderate mitral regurgitation, pulmonary artery hypertension with RV SP of 47 mmHg. review of records notes that cardiology had planned for her to continue Lasix 40 mg daily and start her on spironolactone 12.5 mg daily once returning from [place].  She denies having any bloody/dark stools, but intermittently has nosebleeds.  Previously required a blood transfusion last year when she fell and broke her leg following surgery.    Hospital Course by Problem:   Acute on chronic diastoliccongestive heart failure:  -Patient presents with complaints of shortness of breath. CT imaging revealed small bilateral pleural effusions right greater than left with mild bilateral patchy airspace opacities that could be suggestive of interstitial edema versus atelectasis/infection.  -BNP was elevated at 470.7. -Strict I&Os and daily weights- unfortunately her daily weights range from 160lbs to 410lbs so doubt this is accurate -Elevate lower extremities and place TED hose -TSH elevated as is free T4 -IV Lasix changed to spironolactone based on cards note review from her doctor in [place]: was to start spiro and d/c kdur and Lasix.  per his evaluation had 2+ edema in November, patient knows to get potassium checked when she returns on Monday  Elevated troponin:  -flat and unconcerning  Shortness of breath: Patient symptoms could likely be multifactorial in the setting of CHF/anemia/pulmonary artery hypertension, but given patient's recent flight traveling from [facility] would like to rule out the possibility of  a DVT. - D-dimer elevated -V/Q negative -duplex LE negative -pateint much improved with blood and diuresis  Pancytopenia: Acute on chronic.  -Hemoglobin  dropped to < 7, but previously had about around 8.4 per [facility] of [facility].  -suspect related to cancer treatment -Transfuse2unit of packed red blood cellswith good result -Close follow-up with oncology  Acute kidney injury superimposed on chronic kidney disease stage IIIb:  -baseline Cr unclear -on 12/17: BUN 66, Cr 1.6 -Will need close outpatient follow-up in light of Aldactone being started per cardiology's recommendations  Mantle cell lymphoma: Patient reports that she had been receiving treatment with Rituxan and subsequently Revlimid up into the last month when treatment had to be stopped due to reaction.  History of liver transplanton chronic immunosuppressive therapy: Home medications include CellCept 250 mg twice daily, tacrolimus 1 mg in a.m., and 0.5 mg at night. -Continue home regimen  Primary biliary cirrhosis: Home medications includeursodiol500 mg twice daily. -Continueursodiol  Essential hypertension:  -aldactone  obesity Body mass index is 30.25 kg/m.   As patient has been unable to return home due to the snow will prescribe 7 days of her home medications to bridge the gap  Addendum: Called patient on 1/3 and left message to check to see how she was doing. Patient's husband called back on 1 4 and reported that patient was doing well but Aldactone prescription did not make it to the pharmacy despite AVS showing transmission so this was called in again on 1/4. Patient states she has been taking her Lasix as needed in the meantime. Patient knows to stop any kind of potassium supplementation once she starts Aldactone  Medical Consultants:   None, charts from [place] were reviewed in depth   Discharge Exam:   Vitals:   04/12/20 1114 04/12/20 1514  BP: 125/61 111/62  Pulse: 67 66  Resp: 18 15  Temp: 98 F (36.7 C) 98 F (36.7 C)  SpO2: 96% 96%   Vitals:   04/12/20 0400 04/12/20 0735 04/12/20 1114 04/12/20 1514   BP: (!) 121/46 116/62 125/61 111/62  Pulse: 85 76 67 66  Resp: 20 16 18 15   Temp: 98.2 F (36.8 C) 97.6 F (36.4 C) 98 F (36.7 C) 98 F (36.7 C)  TempSrc: Oral Oral Oral Oral  SpO2: 99% 96% 96% 96%  Weight: 86.7 kg     Height:        General exam: Appears calm and comfortable. Back to baseline per patient and family  The results of significant diagnostics from this hospitalization (including imaging, microbiology, ancillary and laboratory) are listed below for reference.     Procedures and Diagnostic Studies:   DG Chest 2 View  Result Date: 04/09/2020 CLINICAL DATA:  Shortness of breath and congestion EXAM: CHEST - 2 VIEW COMPARISON:  None. FINDINGS: There is a small left pleural effusion. There is a calcified granuloma in the left lower lobe. There is no edema or airspace opacity. Heart is upper normal in size with pulmonary vascularity normal. No adenopathy. No bone lesions. IMPRESSION: Small left pleural effusion. Calcified granuloma left lower lobe. No edema or airspace opacity. Heart upper normal in size. Electronically Signed   By: Lowella Grip III M.D.   On: 04/09/2020 13:46   CT Chest Wo Contrast  Result Date: 04/10/2020 CLINICAL DATA:  Chest pain and shortness of breath EXAM: CT CHEST WITHOUT CONTRAST TECHNIQUE: Multidetector CT imaging of the chest was performed following the standard protocol without IV contrast. COMPARISON:  None. FINDINGS: Cardiovascular: Scattered aortic atherosclerosis is seen. Coronary artery calcifications are noted. The heart size is normal. There is no pericardial thickening or effusion. Mediastinum/Nodes: There are no enlarged mediastinal, hilar or axillary lymph nodes, however limited due to lack of intravenous contrast. The thyroid gland, trachea and esophagus demonstrate no significant findings. Lungs/Pleura: Small bilateral pleural effusions are seen, right greater than left. Minimal patchy airspace opacity seen at both lung bases. Mildly  increased interlobular septal thickening is noted. There is a patchy airspace opacity seen at the periphery of the left lung base and at the right middle lobe and right lung base. Upper abdomen: The visualized portion of the upper abdomen is unremarkable. Musculoskeletal/Chest wall: There is no chest wall mass or suspicious osseous finding. No acute osseous abnormality IMPRESSION: Small bilateral pleural effusions, right greater than left. Mild bilateral patchy airspace opacities most notable in the right middle lobe and right lung base which are nonspecific could be due to atelectasis and/or infectious etiology. Findings which could be suggestive of interstitial edema. Electronically Signed   By: Prudencio Pair M.D.   On: 04/10/2020 01:28   ECHOCARDIOGRAM COMPLETE  Result Date: 04/10/2020    ECHOCARDIOGRAM REPORT   Patient Name:   Kelly Jimenez Date of Exam: 04/10/2020 Medical Rec #:  [id number]       Height:       66.0 in Accession #:    [id number]      Weight:       160.0 lb Date of Birth:  [birth year]       BSA:          1.819 m Patient Age:    2 years        BP:           109/53 mmHg Patient Gender: F               HR:           82 bpm. Exam Location:  Inpatient Procedure: 2D Echo, Color Doppler and Cardiac Doppler Indications:    CHF-Acute Systolic [id number]  History:        Patient has no prior history of Echocardiogram examinations.  Sonographer:    Bernadene Person RDCS Referring Phys: [id number] RONDELL A SMITH IMPRESSIONS  1. Left ventricular ejection fraction, by estimation, is 55 to 60%. The left ventricle has normal function. The left ventricle has no regional wall motion abnormalities. Left ventricular diastolic parameters are consistent with Grade II diastolic dysfunction (pseudonormalization).  2. Right ventricular systolic function is normal. The right ventricular size is normal. There is mildly elevated pulmonary artery systolic pressure. The estimated right ventricular systolic pressure is 0000000 mmHg.   3. Left atrial size was severely dilated.  4. The mitral valve is normal in structure. Mild mitral valve regurgitation. No evidence of mitral stenosis.  5. Tricuspid valve regurgitation is moderate.  6. The aortic valve is normal in structure. Aortic valve regurgitation is not visualized. No aortic stenosis is present.  7. The inferior vena cava is normal in size with greater than 50% respiratory variability, suggesting right atrial pressure of 3 mmHg. FINDINGS  Left Ventricle: Left ventricular ejection fraction, by estimation, is 55 to 60%. The left ventricle has normal function. The left ventricle has no regional wall motion abnormalities. The left ventricular internal cavity size was normal in size. There is  no left ventricular hypertrophy. Left ventricular diastolic parameters are consistent with Grade II diastolic dysfunction (pseudonormalization).  LV Wall Scoring: The posterior wall  is akinetic. Right Ventricle: The right ventricular size is normal. No increase in right ventricular wall thickness. Right ventricular systolic function is normal. There is mildly elevated pulmonary artery systolic pressure. The tricuspid regurgitant velocity is 2.96  m/s, and with an assumed right atrial pressure of 8 mmHg, the estimated right ventricular systolic pressure is 0000000 mmHg. Left Atrium: Left atrial size was severely dilated. Right Atrium: Right atrial size was normal in size. Pericardium: There is no evidence of pericardial effusion. Mitral Valve: The mitral valve is normal in structure. Mild mitral valve regurgitation. No evidence of mitral valve stenosis. Tricuspid Valve: The tricuspid valve is normal in structure. Tricuspid valve regurgitation is moderate . No evidence of tricuspid stenosis. Aortic Valve: The aortic valve is normal in structure. Aortic valve regurgitation is not visualized. No aortic stenosis is present. Pulmonic Valve: The pulmonic valve was normal in structure. Pulmonic valve regurgitation is  not visualized. No evidence of pulmonic stenosis. Aorta: The aortic root is normal in size and structure. Venous: The inferior vena cava is normal in size with greater than 50% respiratory variability, suggesting right atrial pressure of 3 mmHg. IAS/Shunts: No atrial level shunt detected by color flow Doppler.  LEFT VENTRICLE PLAX 2D LVIDd:         4.90 cm  Diastology LVIDs:         2.50 cm  LV e' medial:    8.59 cm/s LV PW:         1.00 cm  LV E/e' medial:  20.4 LV IVS:        1.10 cm  LV e' lateral:   8.55 cm/s LVOT diam:     2.20 cm  LV E/e' lateral: 20.5 LV SV:         154 LV SV Index:   84 LVOT Area:     3.80 cm  RIGHT VENTRICLE RV S prime:     16.30 cm/s TAPSE (M-mode): 3.3 cm LEFT ATRIUM              Index       RIGHT ATRIUM           Index LA diam:        4.60 cm  2.53 cm/m  RA Area:     22.80 cm LA Vol (A2C):   95.5 ml  52.51 ml/m RA Volume:   66.90 ml  36.78 ml/m LA Vol (A4C):   99.4 ml  54.65 ml/m LA Biplane Vol: 108.0 ml 59.38 ml/m  AORTIC VALVE LVOT Vmax:   158.00 cm/s LVOT Vmean:  120.000 cm/s LVOT VTI:    0.404 m  AORTA Ao Root diam: 2.90 cm Ao Asc diam:  3.30 cm MITRAL VALVE                TRICUSPID VALVE MV Area (PHT): 3.27 cm     TR Peak grad:   35.0 mmHg MV Decel Time: 232 msec     TR Vmax:        296.00 cm/s MV E velocity: 175.00 cm/s MV A velocity: 110.00 cm/s  SHUNTS MV E/A ratio:  1.59         Systemic VTI:  0.40 m                             Systemic Diam: 2.20 cm Candee Furbish MD Electronically signed by Candee Furbish MD Signature Date/Time: 04/10/2020/2:00:06 PM    Final    VAS  [place] LOWER EXTREMITY VENOUS (DVT)  Result Date: 04/10/2020  Lower Venous DVT Study Indications: Edema, Swelling, and CHF.  Comparison Study: No prior Performing Technologist: Oda Cogan RDMS, RVT  Examination Guidelines: A complete evaluation includes B-mode imaging, spectral Doppler, color Doppler, and power Doppler as needed of all accessible portions of each vessel. Bilateral testing is considered an  integral part of a complete examination. Limited examinations for reoccurring indications may be performed as noted. The reflux portion of the exam is performed with the patient in reverse Trendelenburg.  +---------+---------------+---------+-----------+----------+--------------+ RIGHT    CompressibilityPhasicitySpontaneityPropertiesThrombus Aging +---------+---------------+---------+-----------+----------+--------------+ CFV      Full           Yes      Yes                                 +---------+---------------+---------+-----------+----------+--------------+ SFJ      Full                                                        +---------+---------------+---------+-----------+----------+--------------+ FV Prox  Full                                                        +---------+---------------+---------+-----------+----------+--------------+ FV Mid   Full                                                        +---------+---------------+---------+-----------+----------+--------------+ FV DistalFull                                                        +---------+---------------+---------+-----------+----------+--------------+ PFV      Full                                                        +---------+---------------+---------+-----------+----------+--------------+ POP      Full           Yes      Yes                                 +---------+---------------+---------+-----------+----------+--------------+ PTV      Full                                                        +---------+---------------+---------+-----------+----------+--------------+ PERO     Full                                                        +---------+---------------+---------+-----------+----------+--------------+  Small cystic structure behind the right knee.  +---------+---------------+---------+-----------+----------+--------------+ LEFT      CompressibilityPhasicitySpontaneityPropertiesThrombus Aging +---------+---------------+---------+-----------+----------+--------------+ CFV      Full           Yes      Yes                                 +---------+---------------+---------+-----------+----------+--------------+ SFJ      Full                                                        +---------+---------------+---------+-----------+----------+--------------+ FV Prox  Full                                                        +---------+---------------+---------+-----------+----------+--------------+ FV Mid   Full                                                        +---------+---------------+---------+-----------+----------+--------------+ FV DistalFull                                                        +---------+---------------+---------+-----------+----------+--------------+ PFV      Full                                                        +---------+---------------+---------+-----------+----------+--------------+ POP      Full           Yes      Yes                                 +---------+---------------+---------+-----------+----------+--------------+ PTV      Full                                                        +---------+---------------+---------+-----------+----------+--------------+ PERO     Full                                                        +---------+---------------+---------+-----------+----------+--------------+     Summary: BILATERAL: - No evidence of deep vein thrombosis seen in the lower extremities, bilaterally. -No evidence of popliteal cyst, bilaterally.   *See table(s) above for measurements and observations. Electronically signed by Erlene Quan  Donzetta Matters MD on 04/10/2020 at 8:17:03 PM.    Final      Labs:   Basic Metabolic Panel: Recent Labs  Lab 04/09/20 1306 04/11/20 0124 04/12/20 0604  NA 135 138 139  K 3.9 4.7 3.9  CL 102 105  103  CO2 20* 22 24  GLUCOSE 113* 90 86  BUN 86* 88* 87*  CREATININE 1.71* 1.93* 1.81*  CALCIUM 9.6 9.3 9.0  MG  --  2.5*  --    GFR Estimated Creatinine Clearance: 28.4 mL/min (A) (by C-G formula based on SCr of 1.81 mg/dL (H)). Liver Function Tests: Recent Labs  Lab 04/12/20 0604  AST 41  ALT 34  ALKPHOS 187*  BILITOT 0.7  PROT 6.4*  ALBUMIN 2.9*   No results for input(s): LIPASE, AMYLASE in the last 168 hours. No results for input(s): AMMONIA in the last 168 hours. Coagulation profile No results for input(s): INR, PROTIME in the last 168 hours.  CBC: Recent Labs  Lab 04/09/20 1306 04/10/20 1324 04/11/20 0124 04/12/20 0604  WBC 3.5*  --  3.2* 3.1*  NEUTROABS  --   --  2.1  --   HGB 7.1* 6.2* 8.2* 8.3*  HCT 22.9* 19.8* 25.2* 26.0*  MCV 96.6  --  90.3 91.5  PLT 94*  --  90* 81*   Cardiac Enzymes: No results for input(s): CKTOTAL, CKMB, CKMBINDEX, TROPONINI in the last 168 hours. BNP: Invalid input(s): POCBNP CBG: No results for input(s): GLUCAP in the last 168 hours. D-Dimer Recent Labs    04/10/20 1324  DDIMER 1.26*   Hgb A1c No results for input(s): HGBA1C in the last 72 hours. Lipid Profile No results for input(s): CHOL, HDL, LDLCALC, TRIG, CHOLHDL, LDLDIRECT in the last 72 hours. Thyroid function studies Recent Labs    04/10/20 1324  TSH 6.764*   Anemia work up No results for input(s): VITAMINB12, FOLATE, FERRITIN, TIBC, IRON, RETICCTPCT in the last 72 hours. Microbiology Recent Results (from the past 240 hour(s))  SARS CORONAVIRUS 2 (TAT 6-24 HRS) Nasopharyngeal Nasopharyngeal Swab     Status: None   Collection Time: 04/10/20  2:50 AM   Specimen: Nasopharyngeal Swab  Result Value Ref Range Status   SARS Coronavirus 2 NEGATIVE NEGATIVE Final    Comment: (NOTE) SARS-CoV-2 target nucleic acids are NOT DETECTED.  The SARS-CoV-2 RNA is generally detectable in upper and lower respiratory specimens during the acute phase of infection.  Negative results do not preclude SARS-CoV-2 infection, do not rule out co-infections with other pathogens, and should not be used as the sole basis for treatment or other patient management decisions. Negative results must be combined with clinical observations, patient history, and epidemiological information. The expected result is Negative.  Fact Sheet for Patients: SugarRoll.be  Fact Sheet for Healthcare Providers: https://www.woods-mathews.com/  This test is not yet approved or cleared by the [place] FDA and  has been authorized for detection and/or diagnosis of SARS-CoV-2 by FDA under an Emergency Use Authorization (EUA). This EUA will remain  in effect (meaning this test can be used) for the duration of the COVID-19 declaration under Se ction 564(b)(1) of the Act, 21 U.S.C. section 360bbb-3(b)(1), unless the authorization is terminated or revoked sooner.  Performed at [facility], [place] [address] [place], [place] [address]   MRSA PCR Screening     Status: None   Collection Time: 04/10/20 10:26 AM   Specimen: Nasopharyngeal  Result Value Ref Range Status   MRSA by PCR NEGATIVE NEGATIVE Final  Comment:        The GeneXpert MRSA Assay (FDA approved for NASAL specimens only), is one component of a comprehensive MRSA colonization surveillance program. It is not intended to diagnose MRSA infection nor to guide or monitor treatment for MRSA infections. Performed at [facility], [place] [address], [place], [place] [address]      Discharge Instructions:   Discharge Instructions    (HEART FAILURE PATIENTS) Call MD:  Anytime you have any of the following symptoms: 1) 3 pound weight gain in 24 hours or 5 pounds in 1 week 2) shortness of breath, with or without a dry hacking cough 3) swelling in the hands, feet or stomach 4) if you have to sleep on extra pillows at night in order to breathe.   Complete by: As  directed    Diet - low sodium heart healthy   Complete by: As directed    Discharge instructions   Complete by: As directed    BMP 1 week re: Cr (and until we know how your potassium reacts with the aldactone be careful about the potassium content of foods-- see attached) Outpatient follow up on TSH/fT4 Wear compression stockings and keep legs elevated   Increase activity slowly   Complete by: As directed      Allergies as of 04/12/2020      Reactions   Statins Other (See Comments)   Pt unsure      Medication List    STOP taking these medications   amLODipine 5 MG tablet Commonly known as: NORVASC   furosemide 40 MG tablet Commonly known as: LASIX     TAKE these medications   acetaminophen 500 MG tablet Commonly known as: TYLENOL Take 1,000 mg by mouth as needed for mild pain.   allopurinol 300 MG tablet Commonly known as: ZYLOPRIM Take 1 tablet (300 mg total) by mouth daily.   atorvastatin 10 MG tablet Commonly known as: LIPITOR Take 1 tablet (10 mg total) by mouth at bedtime.   multivitamins ther. w/minerals Tabs tablet Take 1 tablet by mouth daily. Notes to patient: 04/13/2020   mycophenolate 250 MG capsule Commonly known as: CELLCEPT Take 1 capsule (250 mg total) by mouth 2 (two) times daily.   Oyster Shell Calcium 500-400 MG-UNIT Tabs Take 1 tablet by mouth 3 (three) times daily.   psyllium 0.52 g capsule Commonly known as: REGULOID Take 1 capsule (0.52 g total) by mouth daily.   spironolactone 25 MG tablet Commonly known as: ALDACTONE Take 0.5 tablets (12.5 mg total) by mouth daily. Start taking on: April 13, 2020   tacrolimus 1 MG capsule Commonly known as: PROGRAF Take 1 capsule (1 mg total) by mouth in the morning.   tacrolimus 0.5 MG capsule Commonly known as: PROGRAF Take 1 capsule (0.5 mg total) by mouth every evening.   ursodiol 500 MG tablet Commonly known as: ACTIGALL Take 1 tablet (500 mg total) by mouth in the morning and at  bedtime.       Follow-up Information    PCP in week for potassium levels and kidney function Follow up.                Time coordinating discharge: 35 min  Signed:  Geradine Girt DO  [facility] 04/12/2020, 4:58 PM

## 2020-04-12 NOTE — Progress Notes (Signed)
Pt got discharged to home, discharge instructions provided and patient showed understanding to it, IV taken out,Telemonitor DC,pt left unit in wheelchair with all of the belongings accompanied with a family member (Husband) [name],RN  

## 2020-04-26 ENCOUNTER — Emergency Department: Payer: Self-pay

## 2020-04-26 ENCOUNTER — Inpatient Hospital Stay: Payer: Self-pay

## 2021-04-09 IMAGING — CT CT CHEST W/O CM
2 of 4 series · 15 of 36 positions shown, 18 images · non-contrast
Comparison: None.

CLINICAL DATA: Chest pain and shortness of breath

EXAM:
CT CHEST WITHOUT CONTRAST
TECHNIQUE: Multidetector CT imaging of the chest was performed following the
standard protocol without IV contrast.

[Series 3: chest wo · axial · 0.74mm/px · z∈[-61,+203]mm · 12 of 156 slices shown, 15 images]
[im 12/156  mediastinal]
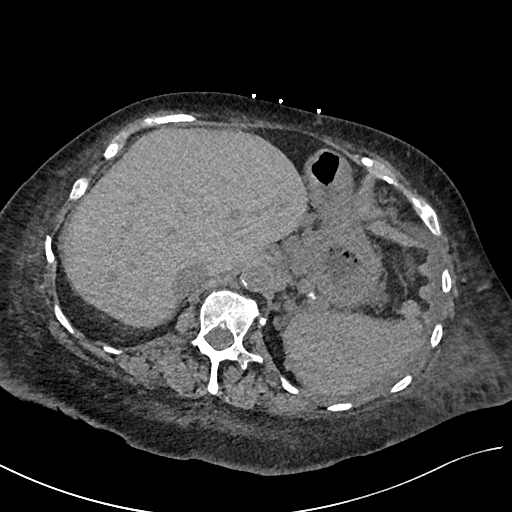
[im 12/156  lung]
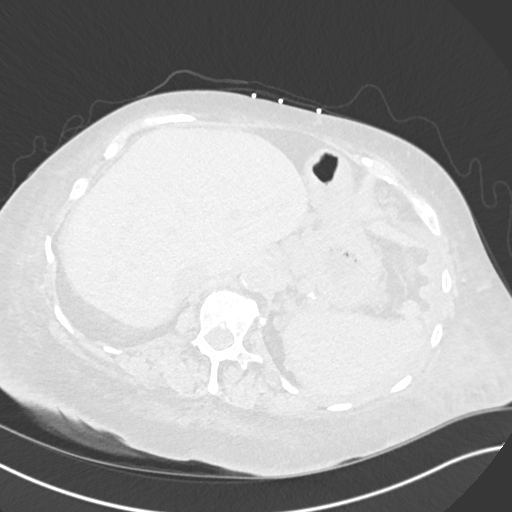
[im 24/156  lung]
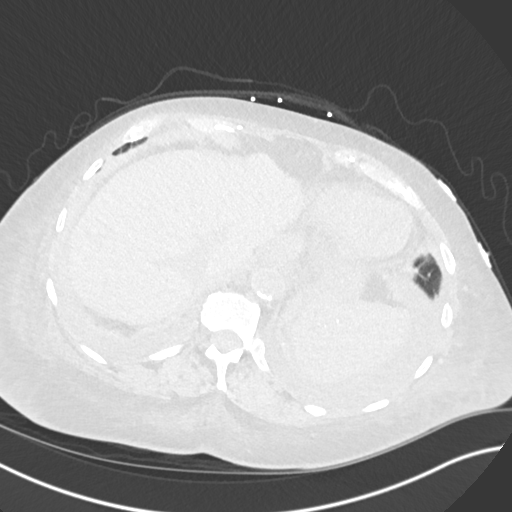
[im 36/156  lung]
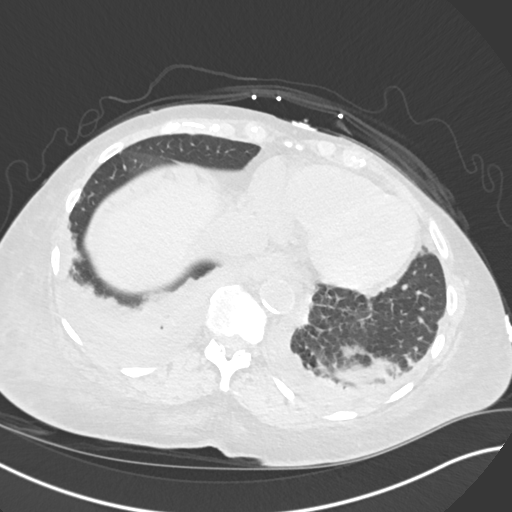
[im 48/156  lung]
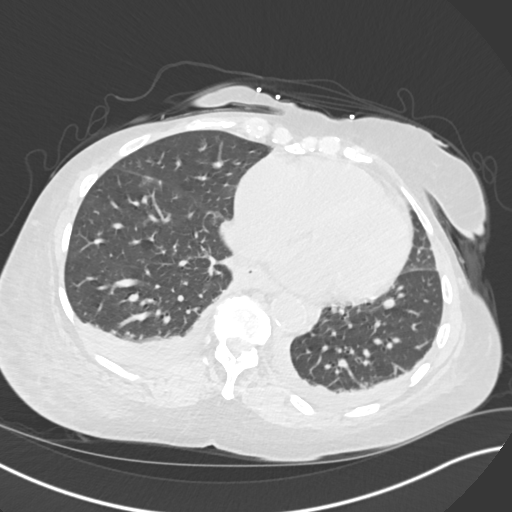
[im 60/156  mediastinal]
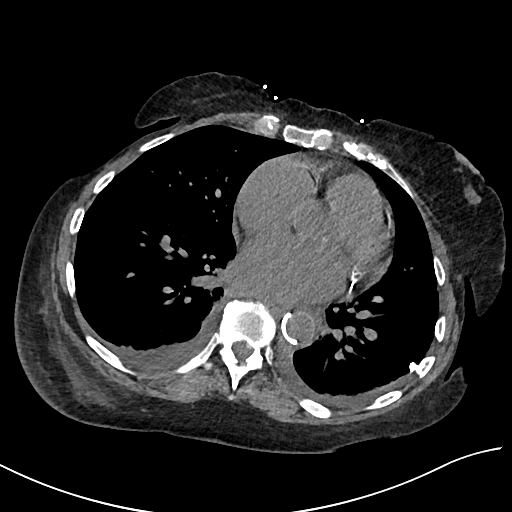
[im 60/156  lung]
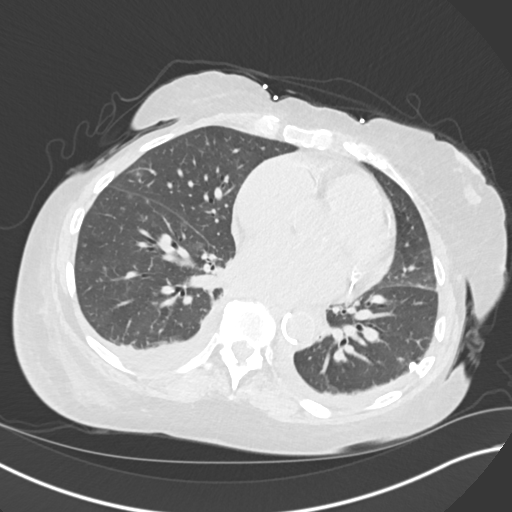
[im 72/156  lung]
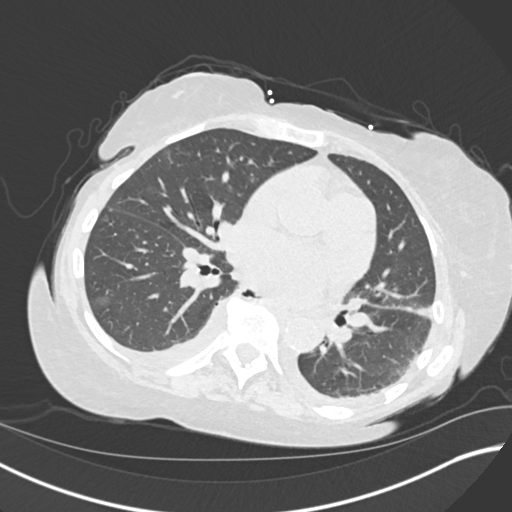
[im 84/156  lung]
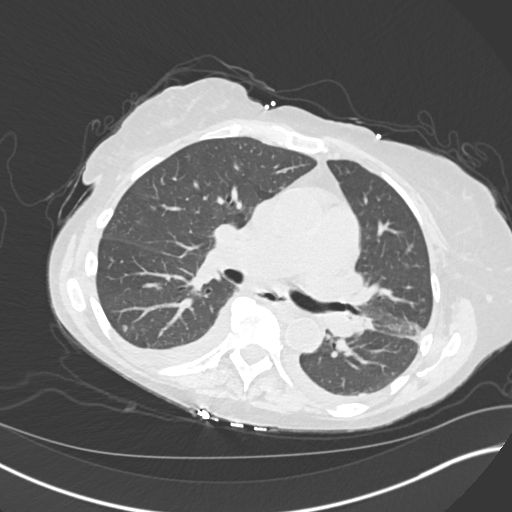
[im 96/156  lung]
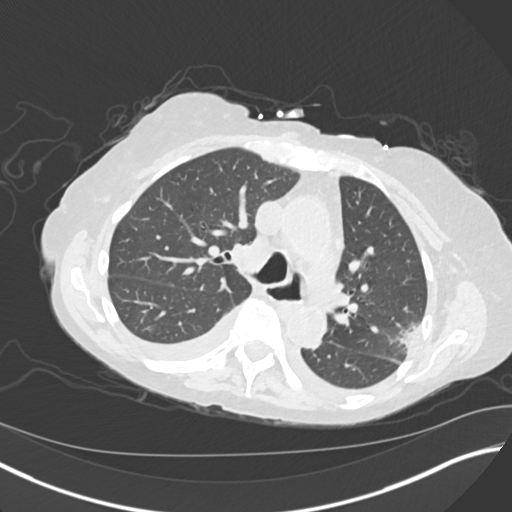
[im 108/156  mediastinal]
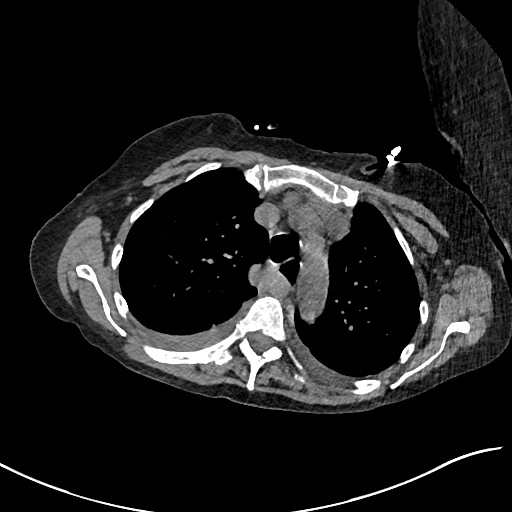
[im 108/156  lung]
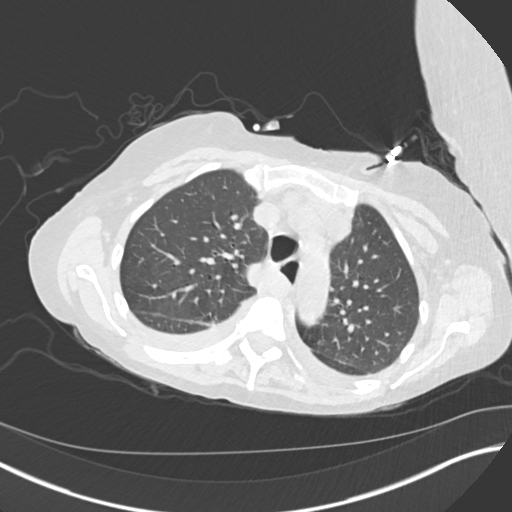
[im 120/156  lung]
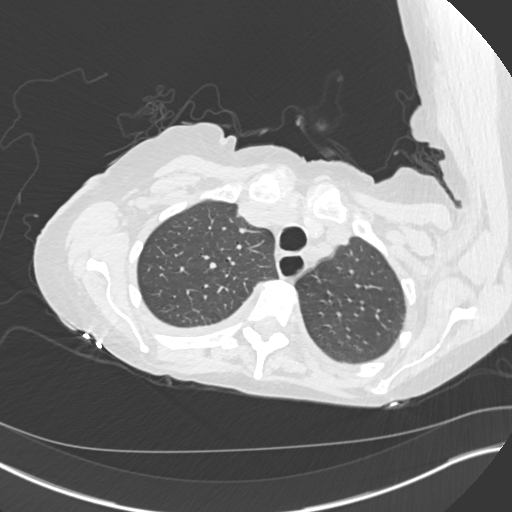
[im 132/156  lung]
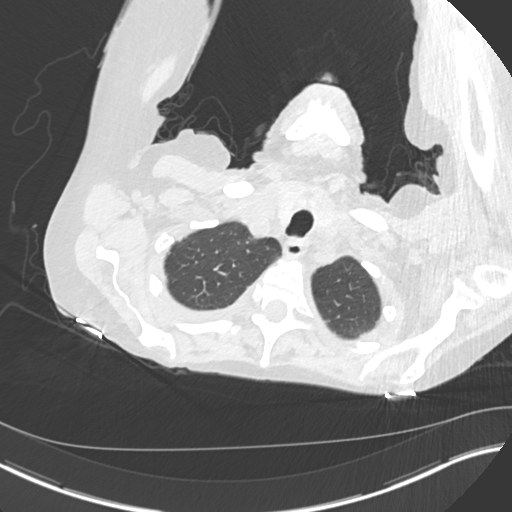
[im 144/156  lung]
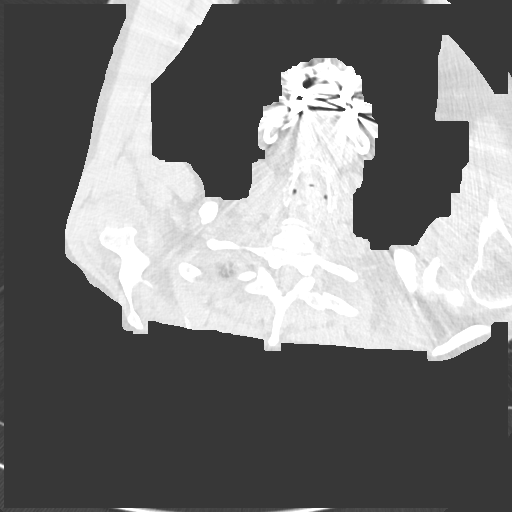

[Series 5: cor · coronal · 0.64mm/px · 3 of 141 slices shown]
[im 29/141  lung]
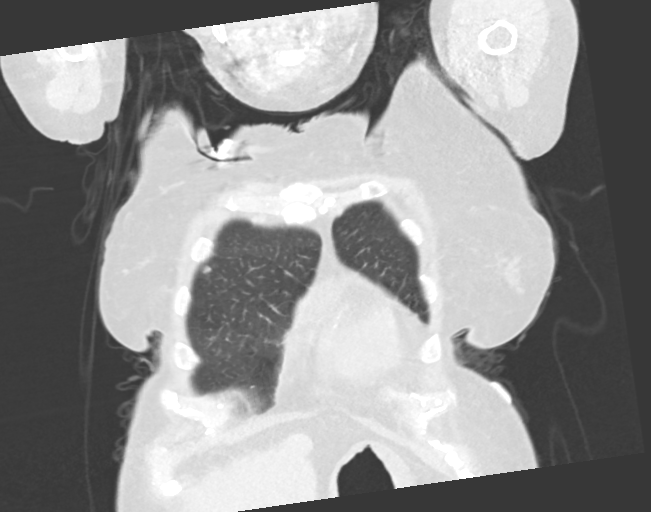
[im 57/141  lung]
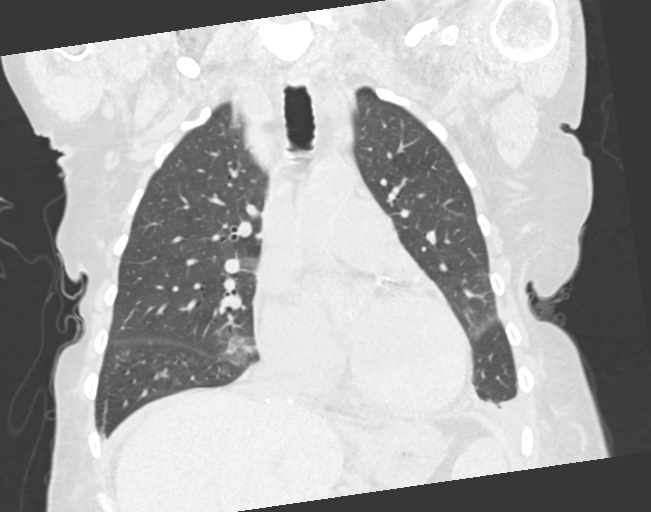
[im 85/141  lung]
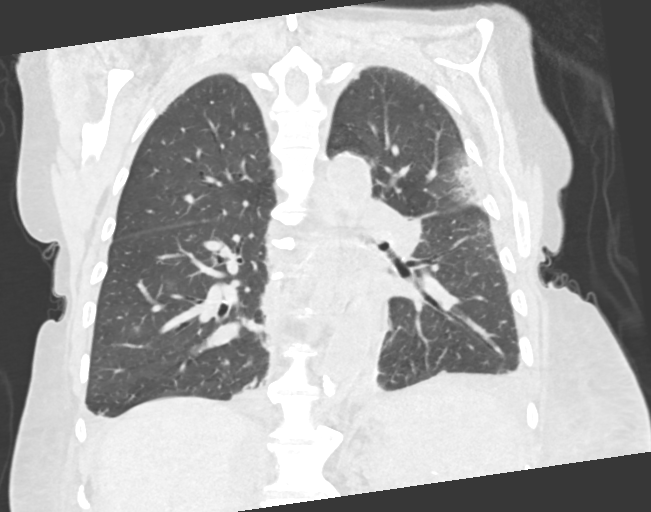

[15 of 36 positions shown; findings below may reference images not displayed]

FINDINGS: Cardiovascular: Scattered aortic atherosclerosis is seen. Coronary
artery calcifications are noted. The heart size is normal. There is
no pericardial thickening or effusion.

Mediastinum/Nodes: There are no enlarged mediastinal, hilar or
axillary lymph nodes, however limited due to lack of intravenous
contrast. The thyroid gland, trachea and esophagus demonstrate no
significant findings.

Lungs/Pleura: Small bilateral pleural effusions are seen, right
greater than left. Minimal patchy airspace opacity seen at both lung
bases. Mildly increased interlobular septal thickening is noted.
There is a patchy airspace opacity seen at the periphery of the left
lung base and at the right middle lobe and right lung base.

Upper abdomen: The visualized portion of the upper abdomen is
unremarkable.

Musculoskeletal/Chest wall: There is no chest wall mass or
suspicious osseous finding. No acute osseous abnormality
IMPRESSION: Small bilateral pleural effusions, right greater than left.

Mild bilateral patchy airspace opacities most notable in the right
middle lobe and right lung base which are nonspecific could be due
to atelectasis and/or infectious etiology.

Findings which could be suggestive of interstitial edema.

## 2022-05-13 ENCOUNTER — Telehealth (HOSPITAL_BASED_OUTPATIENT_CLINIC_OR_DEPARTMENT_OTHER): Payer: Self-pay

## 2022-05-13 ENCOUNTER — Encounter (HOSPITAL_BASED_OUTPATIENT_CLINIC_OR_DEPARTMENT_OTHER): Payer: Self-pay

## 2022-05-13 NOTE — Telephone Encounter (Signed)
A letter has been generated and mailed to the patient to inform them that they are due to schedule their follow-up liver transplant appointment with labs and imaging.

## 2022-05-21 ENCOUNTER — Other Ambulatory Visit (HOSPITAL_BASED_OUTPATIENT_CLINIC_OR_DEPARTMENT_OTHER): Payer: Self-pay

## 2022-05-21 ENCOUNTER — Encounter (HOSPITAL_BASED_OUTPATIENT_CLINIC_OR_DEPARTMENT_OTHER): Payer: Self-pay

## 2022-05-21 DIAGNOSIS — Z79899 Other long term (current) drug therapy: Secondary | ICD-10-CM

## 2022-05-21 DIAGNOSIS — Z48298 Encounter for aftercare following other organ transplant: Secondary | ICD-10-CM

## 2022-05-21 DIAGNOSIS — Z944 Liver transplant status: Secondary | ICD-10-CM

## 2022-06-25 ENCOUNTER — Telehealth (HOSPITAL_BASED_OUTPATIENT_CLINIC_OR_DEPARTMENT_OTHER): Payer: Self-pay

## 2022-06-25 NOTE — Telephone Encounter (Signed)
06/24/22      Pt called to cancel appt on 07/30/22 :25pm and will call back to schedule again.

## 2022-07-15 ENCOUNTER — Telehealth (HOSPITAL_BASED_OUTPATIENT_CLINIC_OR_DEPARTMENT_OTHER): Payer: Self-pay

## 2022-07-15 ENCOUNTER — Encounter (HOSPITAL_BASED_OUTPATIENT_CLINIC_OR_DEPARTMENT_OTHER): Payer: Self-pay

## 2022-07-15 NOTE — Telephone Encounter (Signed)
A letter has been generated and mailed to the patient to inform them that they are due to schedule their follow-up liver transplant appointment with labs and imaging.

## 2022-07-30 ENCOUNTER — Ambulatory Visit (HOSPITAL_BASED_OUTPATIENT_CLINIC_OR_DEPARTMENT_OTHER): Payer: Medicare Other

## 2022-08-06 ENCOUNTER — Telehealth (HOSPITAL_BASED_OUTPATIENT_CLINIC_OR_DEPARTMENT_OTHER): Payer: Self-pay

## 2022-08-06 NOTE — Telephone Encounter (Signed)
I attempted to contact the patient to discuss rescheduling an appointment. I was unable to speak with the patient but was able to speak with their spouse. They state that they will let the patient know that I called and have the patient contact me back.

## 2022-09-01 ENCOUNTER — Telehealth (HOSPITAL_BASED_OUTPATIENT_CLINIC_OR_DEPARTMENT_OTHER): Payer: Self-pay

## 2022-09-01 ENCOUNTER — Encounter (HOSPITAL_BASED_OUTPATIENT_CLINIC_OR_DEPARTMENT_OTHER): Payer: Self-pay

## 2022-09-01 NOTE — Telephone Encounter (Signed)
A letter has been generated and mailed to the patient to inform them that they are due to schedule their follow-up liver transplant appointment with labs and imaging.

## 2022-09-08 ENCOUNTER — Telehealth (HOSPITAL_BASED_OUTPATIENT_CLINIC_OR_DEPARTMENT_OTHER): Payer: Self-pay

## 2022-09-08 NOTE — Telephone Encounter (Signed)
I contacted the patient to discuss scheduling an appointment.    The patient states that they are over 82 years old and undergoing cancer treatment. They do not see why we need to make an appointment. I stated that it's a requirement of transplant but when a patient has other health concerns we do take those into account. I stated that I will send a message to the clinical team and ask how they would like to proceed with assessing the patient's health due to the patient's health and age concerns.    Clinical team, please assess. Once a plan is in place, please reach out to [facility] and patient for next steps.

## 2022-10-13 NOTE — Telephone Encounter (Signed)
Last labs from 2021 and last rx from 2017. Need to touch base regarding labs and meds.

## 2022-10-21 NOTE — Telephone Encounter (Signed)
Called and spoke to pt spouse. He was provided our contact information and will ask her to call back and speak to the nursing staff. He says he will get the message to her when she gets home. He does mention that her MD in [place] is currently managing her medications.

## 2023-02-18 ENCOUNTER — Other Ambulatory Visit (HOSPITAL_BASED_OUTPATIENT_CLINIC_OR_DEPARTMENT_OTHER): Payer: Self-pay

## 2023-02-18 LAB — CBC, DIFF, EXTERNAL
% Basophils, External: 0.6
% Eosinophils, External: 6.7
% Lymphocytes, External: 27
% Monocytes, External: 7
% Neutrophils, External: 58.7
Basophils, External: 0.02
Eosinophil Count, External: 0.24
Hematocrit, External: 28.4 — ABNORMAL LOW
Hemoglobin, External: 9.3 — ABNORMAL LOW
Lymphocyte Count, External: 0.96 — ABNORMAL LOW
MCH, External: 31.1
MCV, External: 95
Monocytes, External: 0.25
Neutrophils, External: 2.09
Platelet Count, External: 109 — ABNORMAL LOW
WBC, External: 3.56 — ABNORMAL LOW

## 2023-02-18 LAB — COMPREHENSIVE METABOLIC PANEL, EXTERNAL
ALT (GPT), External: 27
AST (GOT), External: 30
Albumin, External: 3.2 — ABNORMAL LOW
Alkaline Phosphatase (Total), External: 224 — ABNORMAL HIGH
Bilirubin (Total), External: 0.5 mg/dL
Calcium, External: 8.8
Carbon Dioxide (Total), External: 24
Chloride, External: 106
Creatinine, External: 1.96 — ABNORMAL HIGH
Glucose, External: 111 — ABNORMAL HIGH
Potassium, External: 4.8
Protein (Total), External: 7.2
Sodium, External: 140 mmol/L
Urea Nitrogen, External: 71
eGFR by CKD EPI 2021, External: 25 — ABNORMAL LOW

## 2023-02-18 LAB — LACTATE DEHYDROGENASE, EXTERNAL: Lactate Dehydrogenase, External: 176 U/L

## 2023-02-18 LAB — IRON BINDING CAPACITY, EXTERNAL
Iron, SRM, External: 76 ug/dL
Total Iron Binding Capacity, External: 350 ug/dL
Transferrin Saturation, External: 21.7
Transferrin, External: 250 mg/dL

## 2023-02-18 NOTE — Progress Notes (Signed)
External results entered.

## 2023-02-22 NOTE — Result Encounter Note (Signed)
Last transplant note was from 2020.    Labs 12/27/22 reviewed 02/22/23. Last set of labs was in 2021. Kidneys are worse but liver is good.    Not sure what her IS regimen is.

## 2023-02-24 NOTE — Progress Notes (Signed)
 **Disclaimer to patients reading my notes:    Please be advised that the primary purpose of this note is for me to communicate with myself and other members of your care team. They are not intended for patient-physician communication.   Medical abbreviations and medical terminology may be used.  Dictation software is often used and may contain unintended grammatical and typographical errors. As long as the errors do not change the meaning of the record, they may remain in your record; however, should you have any questions or concerns regarding the documentation, please do not hesitate to contact my office.  Documentation services were performed after the patient or guardian consented to allow Dragon Ambient experience (DAX) to record audio during this visit. The physician/APP reviewed this note before signing.        CLINICAL CARDIOVASCULAR FOLLOW UP EVALUATION    Patient Name: Ebony Woodard  Date of Birth: June 20, 1940  Medical Record Number: [id number]      Primary Care Provider: No Physician on file      ASSESSMENT/PLAN:   Ebony Woodard is a 82 y.o. 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     Congestive heart failure:     ~Patient has ACC stage D heart failure with NYHA Class 4 symptoms  ~Most recent LVEF is 60-65%, grade II diastolic dysfunction.  ~Clinically, the patient appears improved on torsemide PRN  ~ hypoalbuinemia, ckd, relative immobility are all contributing to edema, edema has improved with weight loss and torsemide  ~ She takes torsemide twice daily  ~renal function is stable        Hypertension:  Well above goal in clinic today  She reports home readings are well within normal  She has significant diastolic dysfunction, I think we need to be more aggressive with BP control as she also has LVH on her ekg  Hydralazine caused a rash  Amlodipine  caused chest tightness  We will try increasing her losartan, appreciate she has ckd, will closely monitor  Increase to 50 mg BID       Plan:    ~ Make sure Dr.Jhonson or your PCP is addressing your thyroid  ~ INCREASE losartan to 50mg  twice daily    ~ Check blood pressure around lunch time  ~ If you notice the TOP number of your blood pressure staying consistently above 150, ok to take an extra 1/2 tablet of losartan. If you are having to do this repeatedly, any more than 2 times weekly, please call the office and let us know.  ~Try to stay as active as possible  ~ Weigh yourself daily at the same time. A good time to do this would be every morning after you use the bathroom, without any clothing on. Write down your weight.  If your weight INCREASES by >3 pounds overnight or >5 pounds in one week--Call the office, we may need to adjust your water pill.  ~ Echocardiogram  ~ RTC 6 months  ~We would like to check some blood work prior to next visit. (General health panel, lipid panel)    If you decide to get your labs checked at [facility] family [facility] or a non [place] facility, please have them fax [place] the results at [phone].  We will send you an appointment reminder a few weeks prior to your appointment.  Once you receive this, you can  go to the lab to get your blood drawn.      As a reminder, please make sure to bring ALL YOUR MEDICATIONS TO EACH VISIT WITH THE PILL BOTTLES or an accurate, updated medication list. This is for your safety.  No diagnosis found.      REASON FOR VISIT/CHIEF COMPLAINT:6 month follow up      HISTORY OF PRESENT ILLNESS:      Ebony Woodard is a 82 y.o. 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     She was intially on rituximab for her mantle cell lymphoma.      In November 2021, she was noted by her oncologist to have pulmonary  edema. Echo was done which showed normal LVEF but grade III diastolic dysfunction. In December she was started furosemide 40mg  and spironolactone. She was admitted form 1/15-1/23/22 at [place] for decompensated congestive heart failure. She was noted to have a normal LVEF. She was d/ced on as needed furosemide. She then was noted to have a significantly elevated bun and creatinine so furosemide was d/ced completely. She was changed to torsemide and we have been closely monitoring labs.    We last saw Ebony Woodard on 07/23/22 at which time she was feeling well.     History of Present Illness  The patient is an 82 year old female here for a 17-month follow-up visit. We follow her for hypertension, hyperlipidemia, congestive heart failure, and chronic kidney disease.    She reports no chest pain or discomfort and no breathing difficulties. Her daily activities remain consistent. Her home blood pressure readings range from 131 to 137. She has reduced her torsemide intake to twice a week and has been consuming a significant amount of water. She has not required any hospital or emergency room visits since her last appointment in 08/2022.    She experienced a cold with a mild cough two weeks ago, which led to the cancellation of her previous appointment. She has not contracted COVID-19.    She has an appointment with Dr. Laural Benes on 03/22/2023. She had a transplant 22 years ago and was informed that she has cancer from the antirejection medication from the transplant. She has completed all her colonoscopies.        CURRENT MEDICATIONS:     Current Outpatient Medications:   .  acetaminophen (TYLENOL) 500 mg tablet, Take 2 tablets by mouth every 8 hours as needed for Fever or Discomfort., Disp: , Rfl: 0  .  allopurinol (ZYLOPRIM) 100 mg tablet, take 2 tablets by mouth once daily, Disp: 180 tablet, Rfl: 3  .  atorvaSTATin (LIPITOR) 10 mg tablet, take 1 tablet by mouth nightly, Disp: 90 tablet, Rfl: 0  .  Calcium  Carbonate-Vit D-Min (CALCIUM 1200 PO), 1000 in am and 500 at night., Disp: , Rfl:   .  Coenzyme Q10 100 MG TABS, Take 100 mg by mouth Daily ., Disp: , Rfl:   .  ferrous sulfate 325 mg tablet, Take 1 tablet by mouth daily (with breakfast)., Disp: , Rfl:   .  losartan (COZAAR) 25 mg tablet, take 1 AND 1/2 tablets by mouth twice a day, Disp: 270 tablet, Rfl: 3  .  melatonin 3 mg TABS, Take 1 tablet by mouth nightly as needed., Disp: , Rfl:   .  metoprolol succinate (TOPROL-XL) 25 mg 24 hr tablet, take 1 tablet  by mouth nightly, Disp: 90 tablet, Rfl: 3  .  Multiple Vitamins-Minerals (ADULT MULTIVITAMIN WITH MINERALS/IRON) TABS, Take 1 tablet by mouth Daily., Disp: , Rfl:   .  mycophenolate (CELLCEPT) 250 mg capsule, Take 1 capsule by mouth 2 times daily., Disp: , Rfl:   .  potassium chloride (KLOR-CON M20) 20 mEq ER tablet, take 1 tablet by mouth once daily, Disp: 90 tablet, Rfl: 0  .  tacrolimus (PROGRAF) 0.5 mg capsule, Take 1 capsule by mouth 2 times daily., Disp: , Rfl:   .  torsemide (DEMADEX, SOAANZ) 20 mg tablet, Per Dr Rito Ehrlich 07/23/22: decrease to one tablet on Monday and Friday only.., Disp: 24 tablet, Rfl: 1  .  ursodiol (ACTIGALL) 500 MG tablet, take 1 take by mouth twice a day, Disp: , Rfl:     ALLERGIES:   Allergies       Allergen Reactions    Amlodipine Swelling    And chest tightness           Hydralazine Rash         Statins Itching,Other (See Comments)    Unsure of which statin drug caused this-atorvastatin is ok  Pt unsure         Intolerance       No active intolerances/contraindications                 PAST MEDICAL HISTORY/PERSONAL/SOCIAL/FAMILY HISTORY: Updated and recorded per the EMR.    Past Medical History:   Diagnosis Date   . Arthritis     knee   . Heart murmur     mild/ no treatment   . High blood pressure    . Hyperlipidemia    . Liver transplant recipient [facility]) 12/2000    [place]   . Mantle cell lymphoma (HCC)    . Mantle cell lymphoma (HCC) 09/2019   . Primary biliary cirrhosis (HCC) 2002      s/p liver transplant   . Renal insufficiency     gfr 33   . Wears glasses      Past Surgical History:   Procedure Laterality Date   . BONE BIOPSY  05/01/2020    Procedure: CT GUIDED BIOPSY BONE MARROW - Location: [facility]   . COLONOSCOPY N/A 03/16/2022    Procedure: COLONOSCOPY;  Surgeon: Cena Benton, MD;  Location: [facility]   . GALLBLADDER SURGERY      w/liver transplant   . LIVER TRANSPLANT  12/2000    [place]   . PR COLONOSCOPY W/BIOPSY SINGLE/MULTIPLE  12/20/2013    Procedure: COLONOSCOPY W/ BIOPSY;  Surgeon: Kathreen Devoid, MD;  Location: [facility];  Service: Gastroenterology   . TONSILLECTOMY  1948    TONSILLECTOMY   . TOTAL HIP ARTHROPLASTY Left 08/18/2018    Procedure: ARTHROPLASTY HIP and application short arm splint;  Surgeon: Lauretta Grill, MD;  Location: [facility]   . TOTAL KNEE ARTHROPLASTY Left 11/21/2017    Procedure: ARTHROPLASTY KNEE;  Surgeon: Patriciaann Clan, MD;  Location: [facility]   . UPPER GASTROINTESTINAL ENDOSCOPY N/A 02/09/2022    Procedure: EGD;  Surgeon: Cena Benton, MD;  Location: [facility]           REVIEW OF SYSTEMS:  Review of Systems   Constitutional:  Negative for malaise/fatigue.   HENT: Negative.     Eyes: Negative.    Respiratory: Negative.     Cardiovascular: Negative.    Gastrointestinal: Negative.  Genitourinary: Negative.    Musculoskeletal:  Positive for myalgias.   Skin: Negative.    Neurological: Negative.    Endo/Heme/Allergies:  Bruises/bleeds easily.   Psychiatric/Behavioral: Negative.           PHYSICAL EXAMINATION:   Blood pressure 170/74, pulse 61, resp. rate 14, height 1.676 m (5\' 6" ), weight 77.4 kg (170 lb 9.6 oz), SpO2 98%.  A Detailed cardiology physical exam was performed:  Neck: 12 JVD   Auscultation of heart: Regular rhythm, S1/S2, no S3. 2/6 systolic murmurs.  No gallops or rubs.  Carotid Arteries: brisk upstroke without delay bilaterally, no bruits detected bilaterally  Abdominal aorta: no bruit detected,  not palpated as enlarged  Pedal Pulses: DP or TP pulses were 1+ and symmetric  Extremities: warm, 1+ edema bilaterally    Other physical exam findings:  Neurological - oriented to person, place and time  Psychiatric - no obvious impairment of mood or affect  Respiratory - symmetric chest wall expansion, non-labored respiratory effort, ausculation was clear bilaterally.  no rales were appreciated.  Gastrointestinal - soft, nontender, nondistended, no obvious masses      LABORATORY RESULTS:  Metabolic Panel  Lab Results   Component Value Date/Time    NA 136 02/22/2023 10:55 AM    NA 140 12/27/2022 09:44 AM    NA 138 09/20/2022 09:50 AM    K 4.8 02/22/2023 10:55 AM    K 4.8 12/27/2022 09:44 AM    K 4.1 09/20/2022 09:50 AM    BUN 60 (H) 02/22/2023 10:55 AM    BUN 71 (HH) 12/27/2022 09:44 AM    BUN 63 (HH) 09/20/2022 09:50 AM    BUN 30 (H) 10/07/2016 08:57 AM    BUN 23 08/06/2014 09:52 AM    BUN 33 (H) 06/18/2014 11:18 AM    EGFR 30 (L) 02/22/2023 10:55 AM    EGFR 25 (L) 12/27/2022 09:44 AM    EGFR 29 (L) 09/20/2022 09:50 AM     CBC  Lab Results   Component Value Date/Time    WBC 3.56 (L) 12/27/2022 09:44 AM    WBC 3.75 (L) 09/20/2022 09:50 AM    WBC 4.25 08/24/2022 12:01 PM    HCT 28.4 (L) 12/27/2022 09:44 AM    HCT 26.9 (L) 09/20/2022 09:50 AM    HCT 27.0 (L) 08/24/2022 12:01 PM    PLT 109 (L) 12/27/2022 09:44 AM    PLT 116 (L) 09/20/2022 09:50 AM    PLT 170 08/24/2022 12:01 PM     Lipid Profile  Lab Results   Component Value Date/Time    CHOL 124 (L) 02/22/2023 10:55 AM    CHOL 133 (L) 08/03/2022 01:20 PM    CHOL 153 05/03/2022 10:50 AM    HDL 60 (H) 02/22/2023 10:55 AM    HDL 60 (H) 08/03/2022 01:20 PM    HDL 95 (H) 05/03/2022 10:50 AM    HDL 62 (H) 10/07/2016 08:57 AM    HDL 63 (H) 06/18/2014 11:18 AM    HDL 62 (H) 04/30/2013 10:20 AM    LDL 53 02/22/2023 10:55 AM    LDL 57 08/03/2022 01:20 PM    LDL 48 (L) 05/03/2022 10:50 AM     Thyroid  No components found for: "TSHEDM"      Echocardiogram: 04/29/20    .  Normal  left ventricular size, wall motion and systolic function with estimated LVEF of 68%.  .  Normal right ventricular size and systolic function.  .  Moderate mitral  regurgitation.  Moderate tricuspid regurgitation.  .  No pulmonary hypertension with RVSP of 33 mmHg.  Marland Kitchen  Compared to prior study on 03/05/2020, no significant changes.            Stephens November, M.D.  Electronically Signed      CC: Physician No  No address on file    No referring provider defined for this encounter.    @[email or web address] @

## 2023-02-24 NOTE — Telephone Encounter (Signed)
Outgoing call to Adrita. Spoke to her husband, Gerlene Burdock who states she is currently unavailable. He will have her callback to clinic once she is home. He confirms that he has our clinic phone number.

## 2023-03-02 NOTE — Telephone Encounter (Signed)
 Potassium refill. Next appt 03/22/23 with Dr. Letitia Libra

## 2023-03-22 NOTE — Progress Notes (Signed)
 [facility]  PROGRESS NOTE  Cordie Grice, MD    Pt. Name/Age/DOB:  Ebony Woodard  82 y.o.    08-18-1940   Medical Record Number:   [id number]  Date of Visit: 03/22/2023     Chief Complaint   Patient presents with   . Cancer      Reason for Visit:  Ebony Woodard is a 82 y.o. 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  Revlimid caused severe skin reaction and was discontinued.    -1/15-1/23/22 hospital admission with progressive dyspnea, diastolic heart failure; acute encephalopathy felt to be multifocal from infection, metabolic derangement and medication side effects; acute renal insufficiency; hyponatremia, pancytopenia.  -05/01/20 bone marrow biopsy normocellular with no evidence of active lymphoma, no clear evidence of drug toxicity, pancytopenia may have been from her uncontrolled splenomegaly worsened by her heart failure.  -2/22 plan to reinitiate Rituxan but never able to do so because of persistent neutropenia.  Erythropoietin initiated with response and increase in hematocrit to 30%.  -10/09/20 growth factor suspended after improvement in counts  -10/23 acute on chronic anemia with low iron and no evidence of hemolysis.  Upper and lower endoscopy without bleeding source    Interim History:  Ebony Woodard is overall doing okay.  When we looked at her laboratories Ebony Woodard became concerned about her creatinine and then expresses confusion as to why her cardiologist has recently increased her losartan when her blood pressure is  only elevated in the cardiologist's office.      Ebony Woodard has chronic dyspnea and fatigue, neither have changed.  Ebony Woodard reports no unintended weight loss and no fevers.    Problem List with Overview:  Patient Active Problem List    Diagnosis Date Noted   . Other neutropenia (HCC) 09/18/2020   . Pancytopenia (HCC) 04/28/2020   . Easy bruising 04/28/2020   . Mantle cell lymphoma, extranodal and solid organ sites [facility]) 12/03/2019   . Closed fracture of distal end of left radius with routine healing 08/23/2018   . Acute blood loss as cause of postoperative anemia 08/20/2018   . Closed left hip fracture (HCC) 08/17/2018   . 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 High blood pressure    . Primary biliary cirrhosis (HCC)      Overview Note:     (last update: 09/16/2017)      s/p liver transplant       . Hyperlipidemia          Allergies:   Allergies   Allergen Reactions   . Amlodipine Swelling     And chest tightness     . Hydralazine Rash   . Statins Itching and Other (See Comments)     Unsure of which statin drug caused this-atorvastatin is ok  Pt unsure       Current Medications:  Current Outpatient Medications   Medication Sig Dispense Refill   . acetaminophen (TYLENOL) 500 mg tablet Take 2 tablets by mouth every 8 hours as needed for Fever or Discomfort.  0   . allopurinol (ZYLOPRIM) 100  mg tablet take 2 tablets by mouth once daily 180 tablet 3   . atorvaSTATin (LIPITOR) 10 mg tablet take 1 tablet by mouth nightly 90 tablet 0   . Calcium Carbonate-Vit D-Min (CALCIUM 1200 PO) 1000 in am and 500 at night.     . Coenzyme Q10 100 MG TABS Take 100 mg by mouth Daily .     . ferrous sulfate 325 mg tablet Take 1 tablet by mouth daily (with breakfast) BID  .     . losartan (COZAAR) 50 mg tablet Take 1 tablet by mouth 2 times daily. 180 tablet 3   . melatonin 3 mg TABS Take 1 tablet by mouth at bedtime as needed.     . metoprolol succinate (TOPROL-XL) 25 mg 24 hr tablet take 1 tablet by mouth nightly 90 tablet 3   . Multiple  Vitamins-Minerals (ADULT MULTIVITAMIN WITH MINERALS/IRON) TABS Take 1 tablet by mouth Daily.     . mycophenolate (CELLCEPT) 250 mg capsule Take 1 capsule by mouth 2 times daily.     . potassium chloride (KLOR-CON M20) 20 mEq ER tablet take 1 tablet by mouth once daily 90 tablet 0   . tacrolimus (PROGRAF) 0.5 mg capsule Take 1 capsule by mouth 2 times daily.     Marland Kitchen torsemide (DEMADEX, SOAANZ) 20 mg tablet Per Dr Rito Ehrlich 07/23/22: decrease to one tablet on Monday and Friday only.. 24 tablet 1   . ursodiol (ACTIGALL) 500 MG tablet take 1 take by mouth twice a day       No current facility-administered medications for this visit.       Family History:    Noncontributory    Social History:   Ebony Woodard has been married for over 50 years.  Ebony Woodard is not a smoker       Exam:  Mildly chronically ill-appearing  Vitals:    03/22/23 1200   BP: 164/77   Pulse: 61   Temp: 35.8 C (96.5 F)   78.5 kg  No palpable adenopathy  Lungs are remarkably clear bilaterally  Cardiac exam with a systolic murmur  Abdomen with no hepatosplenomegaly nor ascites  Extremities trace edema    Labs:   Recent Results (from the past 24 hours)   CBC with Differential   Result Value Ref Range    White Blood Cells 3.49 (L) 3.8 - 11.0 K/uL    Red Blood Cells 2.57 (L) 3.70 - 5.10 M/uL    Hemoglobin 8.0 (L) 11.3 - 15.5 g/dL    Hematocrit 16.1 (L) 36.0 - 46.0 %    MCV 96.1 80.0 - 100.0 fL    MCH 31.1 27.0 - 34.0 pg    MCHC 32.4 32.0 - 35.5 g/dL    RDW-CV 09.6 (H) 04.5 - 14.0 %    RDW-SD 54.2 fL    Platelet Count 86 (L) 150 - 400 K/uL    MPV 10.0 9.0 - 12.3 fL    Absolute Neutrophils, Automated 2.33 K/uL    % Neutrophils 66.7 %    % Lymphocytes 22.9 %    % Monocytes 4.9 %    % Eosinophils 4.6 %    % Basophils 0.6 %    % Immature Granulocytes 0.3 %    Absolute Neutrophils 2.33 2.00 - 7.30 K/uL    Absolute Lymphocytes 0.80 (L) 1.00 - 3.40 K/uL    Absolute Monocytes 0.17 0.00 - 0.80 K/uL    Absolute Eosinophils 0.16 0.00 - 0.50 K/uL    Absolute Basophils 0.02 0.00 -  0.10  K/uL    Absolute Immature Granulocytes 0.01 0.00 - 0.03 K/uL    % nRBC 0.0 per 100 WBCs   Comprehensive Metabolic Panel   Result Value Ref Range    Na 139 136 - 145 mmol/L    K 4.9 3.5 - 5.1 mmol/L    Cl 105 98 - 107 mmol/L    CO2 25 21 - 32 mmol/L    Anion Gap 9 5 - 16 mmol/L    Glucose 118 (H) 74 - 106 mg/dL    BUN 71 (HH) 7 - 18 mg/dL    Creatinine 4.74 (HH) 0.60 - 1.00 mg/dL    Calcium 9.4 8.5 - 25.9 mg/dL    Albumin 3.1 (L) 3.4 - 5.0 g/dL    Bilirubin Total 0.6 0.2 - 1.0 mg/dL    Total Protein 7.0 6.0 - 8.5 g/dL    AST 33 15 - 37 U/L    ALT 36 14 - 59 U/L    Alkaline Phosphatase 207 (H) 50 - 136 U/L    Globulin 3.9 (H) 1.8 - 3.5 g/dL    Albumin/Globulin Ratio 0.8 (L) 1.2 - 2.8    BUN/Creatinine Ratio 27.8 (H) 12.0 - 20.0    eGFR 18 (L) >=60 mL/min/1.63m2   Lactate Dehydrogenase   Result Value Ref Range    LDH TOTAL 197 81 - 234 U/L                Radiology: No results found.          Assessment and Recommendations:  Shekera has stage IV mantle cell lymphoma as well as chronic marrow suppression from immunosuppressive agents necessary to treat her liver transplant status.  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  Ebony Woodard has iron deficiency despite taking oral iron with a negative upper and lower endoscopy.  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  Ebony Woodard has diastolic dysfunction and is on diuretics as well as losartan with a recent dose increase in her losartan.      Does not have any B symptoms and her exam is unchanged from baseline.      We reviewed her laboratories with a white count of 3.49, hemoglobin of 8 and platelets of 86.  These are all worse versus her last blood draw in September and even worse than those from June.  There is now clear trend with progressive pancytopenia that I told her we need to evaluate.  My concern is that this represents mantle cell lymphoma and thus we will get a bone marrow  biopsy.  Note B12 and folate were ample about this time last year.  Ebony Woodard will return to see me once the bone marrow biopsy is completed.    Other laboratories include stable prerenal azotemia due to her diuretics.  However, her creatinine has increased 0.55 from her typical baseline around 1.7.  I think this is from the recent increase in her losartan dose and is not too problematic as it is pharmacologic in nature but it really worries Ebony Woodard.  Ebony Woodard again questions whether the losartan dose really needs to have been increased and I have referred her back to Dr. Rito Ehrlich.    Ebony Woodard does have a transplanted liver and her LFTs are quite stable today.  Rejection medications have not been altered and thus is not the cause of her worsening cytopenias.    Electronically signed  by: Trenton Founds, M.D.    03/22/2023   7:21 PM PST      Note created in part with voice recognition software and may contain uncorrected errors  Medical Decision Making:   Number and Complexity of Problems Addressed: Moderate - 1 or more chronic illnesses with exacerbation, progression, or side effects of treatment  Elements of Decision Making: Moderate - Review of external notes, laboratory/radiology results, and test ordering  Risk of Complications and/or Morbidity or Mortality of Patient Management: Moderate - Prescription drug management  Overall: Moderate

## 2023-03-28 NOTE — H&P (Signed)
 REASON FOR APPOINTMENT     Bone marrow biopsy.    HPI   Ebony Woodard is a 82 y.o. year old female with a history of mantle cell lymphoma.    PMH     Past Medical History:   Diagnosis Date   . Arthritis     knee   . Heart murmur     mild/ no treatment   . High blood pressure    . Hyperlipidemia    . Liver transplant recipient [facility]) 12/2000    [place]   . Mantle cell lymphoma (HCC)    . Mantle cell lymphoma (HCC) 09/2019   . Primary biliary cirrhosis (HCC) 2002     s/p liver transplant   . Renal insufficiency     gfr 33   . Wears glasses        ROS   Review of Systems - History obtained from chart review and the patient  negative    ALLERGIES     Allergies   Allergen Reactions   . Amlodipine Swelling     And chest tightness     . Hydralazine Rash   . Statins Itching and Other (See Comments)     Unsure of which statin drug caused this-atorvastatin is ok  Pt unsure       MEDICATIONS     Prior to Admission medications    Medication Sig Start Date End Date Taking? Authorizing Provider   acetaminophen (TYLENOL) 500 mg tablet Take 2 tablets by mouth every 8 hours as needed for Fever or Discomfort. 08/23/18   Rock GORMAN Golas, ARNP   allopurinol (ZYLOPRIM) 100 mg tablet take 2 tablets by mouth once daily 01/03/23   Keene CHRISTELLA Rower, MD   atorvaSTATin (LIPITOR) 10 mg tablet take 1 tablet by mouth nightly 01/31/23   Guru Meenal Krishnan, MD   Calcium Carbonate-Vit D-Min (CALCIUM 1200 PO) 1000 in am and 500 at night.    Historical Provider, MD   Coenzyme Q10 100 MG TABS Take 100 mg by mouth Daily .    Historical Provider, MD   ferrous sulfate 325 mg tablet Take 1 tablet by mouth daily (with breakfast) BID  .    Historical Provider, MD   losartan (COZAAR) 50 mg tablet Take 1 tablet by mouth 2 times daily. 02/24/23   Guru Lawrnce Pebbles, MD   melatonin 3 mg TABS Take 1 tablet by mouth at bedtime as needed.    Historical Provider, MD   metoprolol succinate (TOPROL-XL) 25 mg 24 hr tablet take 1 tablet by mouth nightly 12/03/22    Harvin Lawrnce Pebbles, MD   Multiple Vitamins-Minerals (ADULT MULTIVITAMIN WITH MINERALS/IRON) TABS Take 1 tablet by mouth Daily.    Historical Provider, MD   mycophenolate (CELLCEPT) 250 mg capsule Take 1 capsule by mouth 2 times daily.    Historical Provider, MD   potassium chloride (KLOR-CON M20) 20 mEq ER tablet take 1 tablet by mouth once daily 03/02/23   Keene CHRISTELLA Rower, MD   tacrolimus  (PROGRAF ) 0.5 mg capsule Take 1 capsule by mouth 2 times daily. 04/12/20   [facility]   torsemide (DEMADEX, SOAANZ) 20 mg tablet Per Dr Pebbles 07/23/22: decrease to one tablet on Monday and Friday only.. 09/16/22   Guru Lawrnce Pebbles, MD   ursodiol (ACTIGALL) 500 MG tablet take 1 take by mouth twice a day 04/25/20   Historical Provider, MD       VITALS   Blood pressure 166/63, pulse 60, temperature  36.2 C (97.2 F), temperature source Temporal, resp. rate 16, SpO2 97%.    PERTINENT PHYSICAL EXAM   Cardiac:  regular rate and rhythm  Lungs:  clear to auscultation bilaterally      PERTINENT RECENT PRMCE LABS     Results in Past 30 Days  Result Component Current Result Ref Range Previous Result Ref Range   Creatinine 2.55 (HH) (03/22/2023) 0.60 - 1.00 mg/dL Not in Time Range    eGFR 18 (L) (03/22/2023) >=60 mL/min/1.48m2 Not in Time Range    Hematocrit 24.7 (L) (03/22/2023) 36.0 - 46.0 % Not in Time Range    Hemoglobin 8.0 (L) (03/22/2023) 11.3 - 15.5 g/dL Not in Time Range    K 4.9 (03/22/2023) 3.5 - 5.1 mmol/L Not in Time Range    Platelet Count 86 (L) (03/22/2023) 150 - 400 K/uL Not in Time Range    White Blood Cells 3.49 (L) (03/22/2023) 3.8 - 11.0 K/uL Not in Time Range        CONSENT   I have discussed with the patient and/or the patient representative the indication, alternatives, and the possible risks and/or complications of the planned procedure and the anesthesia methods. The patient and/or patient representative appear to understand and agree to proceed.    PROCEDURAL SEDATION EVALUATION     Procedural Sedation  Pre-Procedure Assessment  Why these Pre-Evaluation questions?    Plan for Sedation:  Level 2: Moderate sedation / analgesia (conscious sedation)    ASA Classification:  Class 3:  Severe systemic disease, definite functional limitations    Sleep Apnea:          Nursing screening for sleep apnea  The patient does not have a history that is consistent with sleep apnea, or positive sleep apnea screen by nursing.     Anesthesia Difficulty:  The patient does not have a history of difficulty with anesthesia, malignant hyperthermia (MH), or a family history of MH.     Airway Evaluation:          How to evaluate an airway  After evaluation the patient's airway, the patient does not show any indication for a difficult airway.             ASSESSMENT & PLAN   Ebony Woodard is a 82 y.o. year old female with mantle cell lymphoma who needs bone marrow biopsy.  Patient seen, evaluated, and is ready to proceed.    Electronically Signed by:  Damien Sergeant, ARNP  03/28/2023 12:32 PM PST

## 2023-04-04 ENCOUNTER — Telehealth (HOSPITAL_BASED_OUTPATIENT_CLINIC_OR_DEPARTMENT_OTHER): Payer: Self-pay

## 2023-04-04 NOTE — Telephone Encounter (Signed)
-----   Message from Daria T sent at 02/24/2023  8:59 AM PST -----  LMCTB.  ----- Message -----  From: Ezzie Dural, MD  Sent: 02/22/2023  11:32 AM PST  To: #    Last transplant note was from 2020.    Labs 12/27/22 reviewed 02/22/23. Last set of labs was in 2021. Kidneys are worse but liver is good.    Not sure what her IS regimen is.

## 2023-04-04 NOTE — Telephone Encounter (Signed)
 Text and VM and letter sent     [facility]

## 2023-04-11 NOTE — Progress Notes (Addendum)
 [facility]  PROGRESS NOTE  KEENE EMERSON ROWER, MD    Pt. Name/Age/DOB:  Ebony Woodard  82 y.o.    02-08-1941   Medical Record Number:   [id number]  Date of Visit: 04/11/2023     Chief Complaint   Patient presents with   . Follow-up     Mantle cell lymphoma, extranodal and solid organ sites      Reason for Visit:  Ebony Woodard is a 82 y.o. 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  Revlimid caused severe skin reaction and was discontinued.    -1/15-1/23/22 hospital admission with progressive dyspnea, diastolic heart failure; acute encephalopathy felt to be multifocal from infection, metabolic derangement and medication side effects; acute renal insufficiency; hyponatremia, pancytopenia.  -05/01/20 bone marrow biopsy normocellular with no evidence of active lymphoma, no clear evidence of drug toxicity, pancytopenia may have been from her uncontrolled splenomegaly worsened by her heart failure.  -2/22 plan to reinitiate Rituxan but never able to do so because of persistent neutropenia.  Erythropoietin initiated with response and increase in hematocrit to 30%.  -10/09/20 growth factor suspended after improvement in counts  -10/23 acute on chronic anemia with low iron and no evidence of hemolysis.  Upper and lower endoscopy without bleeding source  -03/28/23 bone marrow biopsy repeated because of declining blood counts, 1% mantle cell lymphoma, marrow normocellular with normal-appearing trilineage hematopoiesis.  Normal female  karyotype    Interim History:  [name] is overall doing quite well.  She hosted the holidays at her home and says she was able to do everything.  She has minimal fatigue and no dyspnea on exertion.        Problem List with Overview:  Patient Active Problem List    Diagnosis Date Noted   . Other neutropenia (HCC) 09/18/2020   . Pancytopenia (HCC) 04/28/2020   . Easy bruising 04/28/2020   . Mantle cell lymphoma, extranodal and solid organ sites [facility]) 12/03/2019   . Closed fracture of distal end of left radius with routine healing 08/23/2018   . Acute blood loss as cause of postoperative anemia 08/20/2018   . Closed left hip fracture (HCC) 08/17/2018   . 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 High blood pressure    . Primary biliary cirrhosis (HCC)      Overview Note:     (last update: 09/16/2017)      s/p liver transplant       . Hyperlipidemia          Allergies:   Allergies   Allergen Reactions   . Amlodipine Swelling     And chest tightness     . Hydralazine Rash   . Statins Itching and Other (See Comments)     Unsure of which statin drug caused this-atorvastatin is ok  Pt unsure       Current Medications:  Current Outpatient Medications   Medication Sig Dispense Refill   . acetaminophen (TYLENOL) 500 mg tablet Take 2 tablets by mouth every 8 hours as needed for Fever or Discomfort.  0   .  allopurinol (ZYLOPRIM) 100 mg tablet take 2 tablets by mouth once daily 180 tablet 3   . atorvaSTATin (LIPITOR) 10 mg tablet take 1 tablet by mouth nightly 90 tablet 0   . Calcium Carbonate-Vit D-Min (CALCIUM 1200 PO) 1000 in am and 500 at night.     . Coenzyme Q10 100 MG TABS Take 100 mg by mouth Daily .     . ferrous sulfate 325 mg tablet Take 1 tablet by mouth daily (with breakfast) BID  .     . losartan (COZAAR) 50 mg tablet Take 1 tablet by mouth 2 times daily. 180 tablet 3   . melatonin 3 mg TABS Take 1 tablet by mouth at bedtime as needed.     . metoprolol succinate (TOPROL-XL) 25 mg 24 hr tablet take 1 tablet by  mouth nightly 90 tablet 3   . Multiple Vitamins-Minerals (ADULT MULTIVITAMIN WITH MINERALS/IRON) TABS Take 1 tablet by mouth Daily.     . mycophenolate (CELLCEPT) 250 mg capsule Take 1 capsule by mouth 2 times daily.     . potassium chloride (KLOR-CON M20) 20 mEq ER tablet take 1 tablet by mouth once daily 90 tablet 0   . tacrolimus  (PROGRAF ) 0.5 mg capsule Take 1 capsule by mouth 2 times daily.     SABRA torsemide (DEMADEX, SOAANZ) 20 mg tablet Per Dr Verdene 07/23/22: decrease to one tablet on Monday and Friday only.. 24 tablet 1   . ursodiol (ACTIGALL) 500 MG tablet take 1 take by mouth twice a day       No current facility-administered medications for this visit.       Family History:    Noncontributory    Social History:   She has been married for over 50 years.  She is not a smoker       Exam:  Mildly chronically ill-appearing  Vitals:    04/11/23 1200   BP: 138/80   Pulse: 62   Resp: 16   Temp: 36.7 C (98.1 F)   77.6 kg  No palpable adenopathy  Lungs are clear bilaterally  Cardiac exam with a systolic murmur  Abdomen with no hepatosplenomegaly nor ascites  Extremities trace edema    Labs:   Recent Results (from the past 24 hours)   CBC with Differential   Result Value Ref Range    White Blood Cells 3.04 (L) 3.8 - 11.0 K/uL    Red Blood Cells 2.80 (L) 3.70 - 5.10 M/uL    Hemoglobin 8.7 (L) 11.3 - 15.5 g/dL    Hematocrit 72.9 (L) 36.0 - 46.0 %    MCV 96.4 80.0 - 100.0 fL    MCH 31.1 27.0 - 34.0 pg    MCHC 32.2 32.0 - 35.5 g/dL    RDW-CV 84.5 (H) 88.9 - 14.0 %    RDW-SD 54.4 fL    Platelet Count 108 (L) 150 - 400 K/uL    MPV 9.4 9.0 - 12.3 fL    Absolute Neutrophils, Automated 1.94 K/uL    % Neutrophils 63.9 %    % Lymphocytes 23.0 %    % Monocytes 7.9 %    % Eosinophils 3.9 %    % Basophils 1.0 %    % Immature Granulocytes 0.3 %    Absolute Neutrophils 1.94 (L) 2.00 - 7.30 K/uL    Absolute Lymphocytes 0.70 (L) 1.00 - 3.40 K/uL    Absolute Monocytes 0.24 0.00 - 0.80 K/uL    Absolute Eosinophils 0.12 0.00 - 0.50  K/uL    Absolute Basophils 0.03 0.00 - 0.10 K/uL    Absolute Immature Granulocytes 0.01 0.00 - 0.03 K/uL    % nRBC 0.0 per 100 WBCs   Comprehensive Metabolic Panel   Result Value Ref Range    Na 137 136 - 145 mmol/L    K 5.1 3.5 - 5.1 mmol/L    Cl 102 98 - 107 mmol/L    CO2 28 21 - 32 mmol/L    Anion Gap 7 5 - 16 mmol/L    Glucose 104 74 - 106 mg/dL    BUN 66 (HH) 7 - 18 mg/dL    Creatinine 7.84 (H) 0.60 - 1.00 mg/dL    Calcium 9.4 8.5 - 89.8 mg/dL    Albumin 3.3 (L) 3.4 - 5.0 g/dL    Bilirubin Total 0.6 0.2 - 1.0 mg/dL    Total Protein 7.1 6.0 - 8.5 g/dL    AST 28 15 - 37 U/L    ALT 28 14 - 59 U/L    Alkaline Phosphatase 200 (H) 50 - 136 U/L    Globulin 3.8 (H) 1.8 - 3.5 g/dL    Albumin/Globulin Ratio 0.9 (L) 1.2 - 2.8    BUN/Creatinine Ratio 30.7 (H) 12.0 - 20.0    eGFR 23 (L) >=60 mL/min/1.30m2   Lactate Dehydrogenase   Result Value Ref Range    LDH TOTAL 198 81 - 234 U/L          Pathology  Surgical Pathology Exam  Order: [id number]   Status: Edited Result - FINAL       Visible to patient: Yes (seen)       Next appt: 05/11/2023 at 12:50 PM in Oncology [facility] ONC PERI DRAW)    0 Result Notes        Narrative  Performed by: [employer] <redacted file path>  Gender: F  Age: 30  Date of Birth: 12-Nov-1940  ** THIS IS AN ADDENDUM REPORT **  CASE: [id number]  PATIENT: Karalyne Nusser  Pathology Report [id number]  Addendum #1:  Cytogenetics results  FINAL DIAGNOSIS:  A-D: Bone marrow core biopsy, clot preparation, and aspirate:  Low level marrow involvement by mantle cell lymphoma (less than 1% of  cellular marrow elements) involving a normocellular for age marrow with  unremarkable background trilineage hematopoiesis.       Radiology: No results found.          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      She has iron deficiency despite taking oral iron with a negative upper and lower endoscopy in late 2023.      She has diastolic dysfunction and is on diuretics as well as losartan with a recent dose increase in her losartan.      Today her physical exam and symptoms do not suggest progressive lymphoma which is currently untreated with prior cytopenias on Rituxan.    Worsening of her chronic cytopenias in September and early December of this year prompted me to repeat her bone marrow biopsy.  This is reassuring and that there is very minimal mantle cell lymphoma and I do not feel compelled to put her back on treatment.  I suspect her cytopenias are the result of splenomegaly, immunosuppressive agents to prevent liver transplant rejection, likely some chronic disease  and intermittently some iron deficiency.  Today her hemoglobin is a little improved versus her last value now at 8.7 and her platelets are also improved at 108.  These are both very close to her baseline.  However, her white cells are still declining and are now at 3.0.  She is not neutropenic.  The white cells would be the least likely to be affected by her splenomegaly so I have to consider other possibilities.  I wonder if we might be able to decrease her immunosuppression.    I will check B12, folate and copper levels.    Ramla had her liver transplant at the [facility] but does not want to go down there for follow-up.  Dr. Georgianna was managing things for her but he has now retired.  I am going to refer her to the [facility] in the hopes that one of the hepatologist can help us  out here.  She is happy with that plan.    Other laboratories include stable prerenal azotemia due to her diuretics.  LFTs themselves are looking very good.      Electronically signed by: Keene Rower, M.D.    04/11/2023   6:35 PM PST      Note created in part with voice recognition software and may contain uncorrected errors  Medical Decision Making:    Number and Complexity of Problems Addressed: Moderate - 1 or more chronic illnesses with exacerbation, progression, or side effects of treatment  Elements of Decision Making: Moderate - Review of external notes, laboratory/radiology results, and test ordering  Risk of Complications and/or Morbidity or Mortality of Patient Management: Moderate - Prescription drug management  Overall: Moderate

## 2023-04-20 ENCOUNTER — Other Ambulatory Visit (HOSPITAL_BASED_OUTPATIENT_CLINIC_OR_DEPARTMENT_OTHER): Payer: Self-pay

## 2023-04-20 LAB — CBC, DIFF, EXTERNAL
% Basophils, External: 0.3
% Basophils, External: 1
% Eosinophils, External: 0.6
% Eosinophils, External: 3.9
% Lymphocytes, External: 22.9
% Lymphocytes, External: 23
% Monocytes, External: 4.9
% Monocytes, External: 7.9
% Neutrophils, External: 66.7
% Neutrophils, External: 63.9
Basophils, External: 0.02
Basophils, External: 0.03
Eosinophil Count, External: 0.16
Eosinophil Count, External: 0.12
Hematocrit, External: 24.7 — ABNORMAL LOW
Hematocrit, External: 27 — ABNORMAL LOW
Hemoglobin, External: 8 — ABNORMAL LOW
Hemoglobin, External: 8.7 — ABNORMAL LOW
Lymphocyte Count, External: 0.8 — ABNORMAL LOW
Lymphocyte Count, External: 0.7 — ABNORMAL LOW
MCH, External: 31.1
MCH, External: 31.1
MCV, External: 96.1
MCV, External: 96.4
Monocytes, External: 0.17
Monocytes, External: 0.24
Neutrophils, External: 2.33
Neutrophils, External: 1.94 — ABNORMAL LOW
Platelet Count, External: 86 — ABNORMAL LOW
Platelet Count, External: 108 — ABNORMAL LOW
WBC, External: 3.49 — ABNORMAL LOW
WBC, External: 3.04 — ABNORMAL LOW

## 2023-04-20 LAB — COMPREHENSIVE METABOLIC PANEL, EXTERNAL
ALT (GPT), External: 17
ALT (GPT), External: 28
ALT (GPT), External: 36
AST (GOT), External: 28
AST (GOT), External: 29
AST (GOT), External: 33
Albumin, External: 3.1 — ABNORMAL LOW
Albumin, External: 3.3 — ABNORMAL LOW
Albumin, External: 3.5
Alkaline Phosphatase (Total), External: 141 — ABNORMAL HIGH
Alkaline Phosphatase (Total), External: 200 — ABNORMAL HIGH
Alkaline Phosphatase (Total), External: 207 — ABNORMAL HIGH
Bilirubin (Total), External: 0.6 mg/dL
Bilirubin (Total), External: 0.6 mg/dL
Bilirubin (Total), External: 0.8 mg/dL
Calcium, External: 9
Calcium, External: 9.4
Calcium, External: 9.4
Carbon Dioxide (Total), External: 25
Carbon Dioxide (Total), External: 25
Carbon Dioxide (Total), External: 28
Chloride, External: 102
Chloride, External: 103
Chloride, External: 105
Creatinine, External: 1.71 mg/dL — ABNORMAL HIGH
Creatinine, External: 2.15 mg/dL — ABNORMAL HIGH
Creatinine, External: 2.55 mg/dL — ABNORMAL HIGH
Glucose, External: 104
Glucose, External: 118 — ABNORMAL HIGH
Glucose, External: 93
Potassium, External: 4.8 mmol/L
Potassium, External: 4.9
Potassium, External: 5.1
Protein (Total), External: 6.1 — ABNORMAL LOW
Protein (Total), External: 7
Protein (Total), External: 7.1
Sodium, External: 136
Sodium, External: 137 mmol/L
Sodium, External: 139 mmol/L
Urea Nitrogen, External: 60 — ABNORMAL HIGH
Urea Nitrogen, External: 66 — ABNORMAL HIGH
Urea Nitrogen, External: 71 — ABNORMAL HIGH
eGFR by CKD EPI 2021, External: 18 — ABNORMAL LOW
eGFR by CKD EPI 2021, External: 23
eGFR by CKD EPI 2021, External: 30 — ABNORMAL LOW

## 2023-04-20 LAB — LIPID PANEL, EXTERNAL
Cholesterol (HDL), External: 60 — ABNORMAL HIGH
Cholesterol (LDL), External: 53
Cholesterol (Total), External: 124 — ABNORMAL LOW
Cholesterol/HDL Ratio, External: 2.1
Triglyceride, External: 56

## 2023-04-20 LAB — FOLATE, EXTERNAL: Folate, SRM, External: 36.6 — ABNORMAL HIGH

## 2023-04-20 LAB — THYROXINE (FREE), EXTERNAL: Thyroxine (Free), External: 3.19 — ABNORMAL HIGH

## 2023-04-20 LAB — THYROID STIMULATING HORMONE, EXTERNAL: Thyroid Stimulating Hormone, External: 5.51 — ABNORMAL HIGH

## 2023-04-20 LAB — VITAMIN B12 (COBALAMIN), EXTERNAL: Vitamin B12 (Cobalamin), External: 1360 — ABNORMAL HIGH

## 2023-04-20 NOTE — Progress Notes (Signed)
External results inputted

## 2023-04-21 ENCOUNTER — Encounter (HOSPITAL_BASED_OUTPATIENT_CLINIC_OR_DEPARTMENT_OTHER): Payer: Self-pay

## 2023-04-21 NOTE — Result Encounter Note (Signed)
No change to current regimen of immunosuppression dose(s) at this time.    Recheck lab per protocol.

## 2023-04-28 ENCOUNTER — Telehealth (HOSPITAL_BASED_OUTPATIENT_CLINIC_OR_DEPARTMENT_OTHER): Payer: Self-pay

## 2023-04-28 NOTE — Telephone Encounter (Signed)
-----   Message from Post Liver Transplant RN Coordinator sent at 04/04/2023 12:36 PM PST -----  12/23 VM, text letter sent, ck status  ----- Message -----  From: Marlena Clipper, RN  Sent: 03/03/2023  12:00 AM PST  To: #    [facility].  ----- Message -----  From: Ezzie Dural, MD  Sent: 02/22/2023  11:32 AM PST  To: #    Last transplant note was from 2020.    Labs 12/27/22 reviewed 02/22/23. Last set of labs was in 2021. Kidneys are worse but liver is good.    Not sure what her IS regimen is.

## 2023-04-28 NOTE — Telephone Encounter (Signed)
 See TE dated 09/08/23    Called and spoke to [place] who again said that Ebony Woodard was unavailable. He states that Ebony Woodard has no plans of returning [place] for post liver care and that [place] has not been managing her care for some time. He was advised that her transplant chart will be closed out if she does not wish to have [place] follow her transplant. He confirmed this is what she would like.     Txp episode closed.

## 2023-09-11 ENCOUNTER — Emergency Department: Payer: Self-pay

## 2024-01-09 NOTE — Progress Notes (Signed)
 [facility]  PROGRESS NOTE  Ebony EMERSON ROWER, MD    Pt. Name/Age/DOB:  Ebony Woodard  83 y.o.    1941/02/10   Medical Record Number:   [id number]  Date of Visit: 01/09/2024     Chief Complaint   Patient presents with   . Follow-up          Reason for Visit:  Ebony Woodard is a 83 y.o. 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  Revlimid caused severe skin reaction and was discontinued.    -1/15-1/23/22 hospital admission with progressive dyspnea, diastolic heart failure; acute encephalopathy felt to be multifocal from infection, metabolic derangement and medication side effects; acute renal insufficiency; hyponatremia, pancytopenia.  -05/01/20 bone marrow biopsy normocellular with no evidence of active lymphoma, no clear evidence of drug toxicity, pancytopenia may have been from her uncontrolled splenomegaly worsened by her heart failure.  -2/22 plan to reinitiate Rituxan but never able to do so because of persistent neutropenia.  Erythropoietin initiated with response and increase in hematocrit to 30%.  -10/09/20 growth factor suspended after improvement in counts  -10/23 acute on chronic anemia with low iron and no evidence of hemolysis.  Upper and lower endoscopy without bleeding source  -03/28/23 bone marrow biopsy repeated because of declining blood counts, 1% mantle cell lymphoma, marrow normocellular with normal-appearing trilineage hematopoiesis.  Normal female karyotype    Interim History:  Ebony Woodard is here follow-up of her  mantle cell lymphoma and cytopenias.  She continues to feel very well and says she through her daughter a birthday party with dozens of people since I last saw her.  She has no deficit of energy.      She has also followed through with scheduling her MRI which will be followed by follow-up with Dr. Zenaida in GI for her liver transplant status.  The MRI is next week I believe.      She reports a good appetite, no night sweats and no dyspnea.          Problem List with Overview:  Patient Active Problem List    Diagnosis Date Noted   . Other neutropenia 09/18/2020   . Pancytopenia (HCC) 04/28/2020   . Easy bruising 04/28/2020   . Mantle cell lymphoma, extranodal and solid organ sites [facility]) 12/03/2019   . Closed fracture of distal end of left radius with routine healing 08/23/2018   . Acute blood loss as cause of postoperative anemia 08/20/2018   . Closed left hip fracture (HCC) 08/17/2018   . 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 High blood pressure    . Primary biliary cirrhosis (HCC)      Overview Note:     (last update: 09/16/2017)      s/p liver transplant       . Hyperlipidemia          Allergies:   Allergies   Allergen Reactions   . Amlodipine Swelling     And chest tightness     . Hydralazine Rash   . Statins Itching and Other (See Comments)  Unsure of which statin drug caused this-atorvastatin is ok  Pt unsure       Current Medications:  Current Outpatient Medications   Medication Sig Dispense Refill   . acetaminophen (TYLENOL) 500 mg tablet Take 2 tablets by mouth every 8 hours as needed for Fever or Discomfort.  0   . allopurinol (ZYLOPRIM) 100 mg tablet take 2 tablets by mouth once daily 180 tablet 3   . atorvaSTATin (LIPITOR) 10 mg tablet Take 1 tablet by mouth at bedtime. 90 tablet 3   . Calcium Carbonate-Vit D-Min (CALCIUM 1200 PO) 1000 in am and 500 at night.     . Coenzyme Q10 100 MG TABS Take 100 mg by mouth Daily .     . ferrous sulfate 325 mg tablet Take 1 tablet by mouth daily (with  breakfast) BID  .     . losartan (COZAAR) 50 mg tablet Take 1 tablet by mouth 2 times daily. 180 tablet 3   . melatonin 3 mg TABS Take 1 tablet by mouth at bedtime as needed.     . metoprolol succinate (TOPROL-XL) 25 mg 24 hr tablet take 1 tablet by mouth nightly 90 tablet 1   . Multiple Vitamins-Minerals (ADULT MULTIVITAMIN WITH MINERALS/IRON) TABS Take 1 tablet by mouth Daily.     . mycophenolate (CELLCEPT) 250 mg capsule Take 1 capsule by mouth 2 times daily.     . potassium chloride (KLOR-CON M20) 20 mEq ER tablet take 1 tablet by mouth once daily 90 tablet 0   . tacrolimus  (PROGRAF ) 0.5 mg capsule Take 1 capsule by mouth 2 times daily.     SABRA torsemide (DEMADEX, SOAANZ) 20 mg tablet Per Dr Verdene 07/23/22: decrease to one tablet on Monday and Friday only.. 24 tablet 1   . ursodiol (ACTIGALL) 500 MG tablet take 1 take by mouth twice a day       No current facility-administered medications for this visit.       Family History:    Noncontributory    Social History:   She has been married for over 50 years.  She is not a smoker       Exam:  Remarkably well-appearing  Vitals:    01/09/24 1200   BP: (P) 120/78   Pulse: 72   Temp: 36.7 C (98 F)   74.8 kg  No palpable adenopathy  Lungs are clear bilaterally  Cardiac exam with a systolic murmur  Abdomen with no hepatosplenomegaly nor ascites  Extremities trace edema    Labs:   Recent Results (from the past 24 hours)   CBC with Differential   Result Value Ref Range    White Blood Cells 4.93 3.8 - 11.0 K/uL    Red Blood Cells 2.66 (L) 3.70 - 5.10 M/uL    Hemoglobin 8.4 (L) 11.3 - 15.5 g/dL    Hematocrit 73.8 (L) 34.0 - 46.0 %    MCV 98.1 80.0 - 100.0 fL    MCH 31.6 27.0 - 34.0 pg    MCHC 32.2 32.0 - 35.5 g/dL    RDW-CV 85.4 88.9 - 84.4 %    RDW-SD 51.8 fL    Platelet Count 120 (L) 150 - 400 K/uL    MPV 9.7 9.4 - 12.3 fL    Absolute Neutrophils, Automated 3.46 K/uL    % Neutrophils 70.2 %    % Lymphocytes 18.9 %    % Monocytes 7.3 %    % Eosinophils 2.8 %    % Basophils  0.6 %    % Immature Granulocytes 0.2 %    Absolute Neutrophils 3.46 2.00 - 7.30 K/uL    Absolute Lymphocytes 0.93 (L) 1.00 - 3.40 K/uL    Absolute Monocytes 0.36 0.00 - 0.80 K/uL    Absolute Eosinophils 0.14 0.00 - 0.50 K/uL    Absolute Basophils 0.03 0.00 - 0.10 K/uL    Absolute Immature Granulocytes 0.01 0.00 - 0.03 K/uL    % nRBC 0.0 per 100 WBCs   Comprehensive Metabolic Panel   Result Value Ref Range    Na 138 135 - 145 mmol/L    K 4.5 3.5 - 5.3 mmol/L    Cl 106 99 - 109 mmol/L    CO2 24 20 - 36 mmol/L    Anion Gap 8 5 - 16 mmol/L    Glucose 90 65 - 99 mg/dL    BUN 46 (H) 8 - 25 mg/dL    Creatinine 8.34 (H) 0.50 - 1.00 mg/dL    Calcium 9.2 8.5 - 89.7 mg/dL    Albumin 3.7 3.3 - 4.8 g/dL    Bilirubin Total 0.7 0.0 - 1.5 mg/dL    Total Protein 6.0 (L) 6.2 - 8.2 g/dL    AST 28 10 - 45 U/L    ALT 18 10 - 65 U/L    Alkaline Phosphatase 166 (H) 35 - 115 U/L    Globulin 2.3 1.8 - 3.5 g/dL    Albumin/Globulin Ratio 1.6 1.2 - 2.8    BUN/Creatinine Ratio 27.9 (H) 12.0 - 20.0    eGFR 31 (L) >=60 mL/min/1.70m2   Lactate Dehydrogenase   Result Value Ref Range    LDH TOTAL 155 99 - 299 U/L               Radiology: No results found.          Assessment and Recommendations:  Ebony Woodard is an 83 yo woman with stage IV mantle cell lymphoma as well as chronic marrow suppression from immunosuppressive agents necessary to treat her liver transplant status.  She also has splenomegaly from her liver disease contributing to her cytopenias.  Additional medical issues include diastolic heart dysfunction and liver transplant status.    Previously she was on Rituxan for her mantle cell lymphoma but it was worsening her cytopenias and bone marrow showed only 1% involvement in 2024.  We have decided to withhold treatment unless there is clinical evidence of progressive mantle cell lymphoma which I am not seeing today based on symptoms or physical exam.    We reviewed her labs which I was a little concerned about in mid August as they seem to be  downtrending but today they are improved with a white count of 4.9, hemoglobin is up to 8.4 and her platelets are 120.  This is reassuring and we will continue to monitor.    Chemistries are remarkable for prerenal azotemia consistent with her diuretic use.      [place] has a history of iron deficiency with negative upper and lower endoscopy in late 2023.  Iron studies were adequate in late April 2025 and she reports no bleeding.      Liver transplant status will be managed by Dr. Zenaida.  MRI is pending and is anticipated to be followed by an appointment with her gastroenterologist.    She will return to see me in 8 weeks.      Electronically signed by: Ebony Woodard, M.D.    01/09/2024   6:33 PM PDT  Medical Decision Making:   Number and Complexity of Problems Addressed: High - Acute or chronic illness that poses a threat to life or bodily function, mantle cell lymphoma and pancytopenia of multifactorial etiology  Elements of Decision Making: Moderate - Review of external notes, laboratory/radiology results, and test ordering  Risk of Complications and/or Morbidity or Mortality of Patient Management: Moderate - care coordination for multiple interrelated chronic illnesses each of which has a risk of morbidity and mortality  Overall: Moderate      Note created in part with voice recognition software and may contain uncorrected errors
